# Patient Record
Sex: Female | Born: 1960 | Race: White | Hispanic: No | Marital: Single | Smoking: Never smoker
Health system: Southern US, Community
[De-identification: ages and names within clinical notes are randomized; demographics above are authoritative.]

## PROBLEM LIST (undated history)

## (undated) DIAGNOSIS — J45909 Unspecified asthma, uncomplicated: Secondary | ICD-10-CM

## (undated) DIAGNOSIS — F32A Depression, unspecified: Secondary | ICD-10-CM

## (undated) DIAGNOSIS — L723 Sebaceous cyst: Principal | ICD-10-CM

## (undated) DIAGNOSIS — E079 Disorder of thyroid, unspecified: Secondary | ICD-10-CM

## (undated) DIAGNOSIS — M1990 Unspecified osteoarthritis, unspecified site: Secondary | ICD-10-CM

## (undated) DIAGNOSIS — F329 Major depressive disorder, single episode, unspecified: Secondary | ICD-10-CM

## (undated) DIAGNOSIS — I10 Essential (primary) hypertension: Secondary | ICD-10-CM

## (undated) DIAGNOSIS — L089 Local infection of the skin and subcutaneous tissue, unspecified: Secondary | ICD-10-CM

## (undated) DIAGNOSIS — E785 Hyperlipidemia, unspecified: Secondary | ICD-10-CM

## (undated) HISTORY — DX: Hyperlipidemia, unspecified: E78.5

## (undated) HISTORY — DX: Unspecified asthma, uncomplicated: J45.909

## (undated) HISTORY — DX: Local infection of the skin and subcutaneous tissue, unspecified: L08.9

## (undated) HISTORY — DX: Sebaceous cyst: L72.3

## (undated) HISTORY — PX: NASAL SINUS SURGERY: SHX719

## (undated) HISTORY — DX: Major depressive disorder, single episode, unspecified: F32.9

## (undated) HISTORY — PX: PENILE ADHESIONS LYSIS: SHX2198

## (undated) HISTORY — DX: Depression, unspecified: F32.A

## (undated) HISTORY — DX: Unspecified osteoarthritis, unspecified site: M19.90

## (undated) HISTORY — DX: Disorder of thyroid, unspecified: E07.9

---

## 1998-12-20 ENCOUNTER — Other Ambulatory Visit: Admission: RE | Admit: 1998-12-20 | Discharge: 1998-12-20 | Payer: Self-pay | Admitting: Obstetrics and Gynecology

## 1999-01-09 ENCOUNTER — Encounter: Admission: RE | Admit: 1999-01-09 | Discharge: 1999-04-09 | Payer: Self-pay | Admitting: Obstetrics and Gynecology

## 2000-05-16 ENCOUNTER — Other Ambulatory Visit: Admission: RE | Admit: 2000-05-16 | Discharge: 2000-05-16 | Payer: Self-pay | Admitting: Obstetrics and Gynecology

## 2001-04-13 ENCOUNTER — Other Ambulatory Visit: Admission: RE | Admit: 2001-04-13 | Discharge: 2001-04-13 | Payer: Self-pay | Admitting: Obstetrics and Gynecology

## 2002-05-10 ENCOUNTER — Other Ambulatory Visit: Admission: RE | Admit: 2002-05-10 | Discharge: 2002-05-10 | Payer: Self-pay | Admitting: Obstetrics and Gynecology

## 2003-06-08 ENCOUNTER — Other Ambulatory Visit: Admission: RE | Admit: 2003-06-08 | Discharge: 2003-06-08 | Payer: Self-pay | Admitting: Obstetrics and Gynecology

## 2004-06-18 ENCOUNTER — Emergency Department (HOSPITAL_COMMUNITY): Admission: EM | Admit: 2004-06-18 | Discharge: 2004-06-18 | Payer: Self-pay | Admitting: Emergency Medicine

## 2007-07-22 ENCOUNTER — Encounter: Admission: RE | Admit: 2007-07-22 | Discharge: 2007-07-22 | Payer: Self-pay | Admitting: Obstetrics and Gynecology

## 2008-01-04 ENCOUNTER — Encounter: Admission: RE | Admit: 2008-01-04 | Discharge: 2008-01-04 | Payer: Self-pay | Admitting: Obstetrics and Gynecology

## 2010-10-09 ENCOUNTER — Emergency Department (HOSPITAL_COMMUNITY)
Admission: EM | Admit: 2010-10-09 | Discharge: 2010-10-09 | Disposition: A | Discharge status: Home / Self Care | Payer: PRIVATE HEALTH INSURANCE | Attending: Emergency Medicine | Admitting: Emergency Medicine

## 2010-10-09 ENCOUNTER — Emergency Department (HOSPITAL_COMMUNITY): Payer: PRIVATE HEALTH INSURANCE

## 2010-10-09 DIAGNOSIS — W208XXA Other cause of strike by thrown, projected or falling object, initial encounter: Secondary | ICD-10-CM | POA: Insufficient documentation

## 2010-10-09 DIAGNOSIS — S9030XA Contusion of unspecified foot, initial encounter: Secondary | ICD-10-CM | POA: Insufficient documentation

## 2010-10-09 DIAGNOSIS — M79609 Pain in unspecified limb: Secondary | ICD-10-CM | POA: Insufficient documentation

## 2010-10-09 DIAGNOSIS — I10 Essential (primary) hypertension: Secondary | ICD-10-CM | POA: Insufficient documentation

## 2012-01-10 ENCOUNTER — Encounter (HOSPITAL_COMMUNITY): Payer: Self-pay | Admitting: Emergency Medicine

## 2012-01-10 ENCOUNTER — Emergency Department (HOSPITAL_COMMUNITY)
Admission: EM | Admit: 2012-01-10 | Discharge: 2012-01-10 | Disposition: A | Discharge status: Home / Self Care | Payer: Self-pay | Attending: Emergency Medicine | Admitting: Emergency Medicine

## 2012-01-10 DIAGNOSIS — K0889 Other specified disorders of teeth and supporting structures: Secondary | ICD-10-CM

## 2012-01-10 DIAGNOSIS — K089 Disorder of teeth and supporting structures, unspecified: Secondary | ICD-10-CM | POA: Insufficient documentation

## 2012-01-10 DIAGNOSIS — I10 Essential (primary) hypertension: Secondary | ICD-10-CM | POA: Insufficient documentation

## 2012-01-10 HISTORY — DX: Essential (primary) hypertension: I10

## 2012-01-10 MED ORDER — CLINDAMYCIN HCL 300 MG PO CAPS
300.0000 mg | ORAL_CAPSULE | Freq: Once | ORAL | Status: AC
  Administered 2012-01-10: 300 mg via ORAL
  Filled 2012-01-10: qty 1

## 2012-01-10 MED ORDER — IBUPROFEN 200 MG PO TABS
600.0000 mg | ORAL_TABLET | Freq: Once | ORAL | Status: AC
  Administered 2012-01-10: 600 mg via ORAL
  Filled 2012-01-10: qty 1

## 2012-01-10 MED ORDER — CLINDAMYCIN HCL 300 MG PO CAPS: 300.0000 mg | ORAL_CAPSULE | Freq: Four times a day (QID) | ORAL | Status: AC

## 2012-01-10 MED ORDER — IBUPROFEN 600 MG PO TABS: 600.0000 mg | ORAL_TABLET | Freq: Three times a day (TID) | ORAL | Status: AC | PRN

## 2012-01-10 MED ORDER — OXYCODONE-ACETAMINOPHEN 5-325 MG PO TABS: 1.0000 | ORAL_TABLET | ORAL | Status: AC | PRN

## 2012-01-10 NOTE — ED Provider Notes (Signed)
History     CSN: [id number]  Arrival date & time 01/10/12  0903   First MD Initiated Contact with Patient 01/10/12 [phone]      Chief Complaint  Patient presents with  . Facial Swelling  . Dental Pain    (Consider location/radiation/quality/duration/timing/severity/associated sxs/prior treatment) The history is provided by the patient.   the patient reports several days of worsening pain in tooth #5.  She reports she woke this morning with right-sided facial swelling.  She's had dental pain for approximately 6 months but has never seen a dentist regarding this.  Her symptoms worsened a couple days ago.  She denies difficulty breathing or swallowing.  She has no other complaints.  Her pain is moderate in severity at this time.  Her pain is worsened by palpation of her tooth.  She has tried [place] powders at home for discomfort without relief in her symptoms  Past Medical History  Diagnosis Date  . Hypertension     History reviewed. No pertinent past surgical history.  No family history on file.  History  Substance Use Topics  . Smoking status: Never Smoker   . Smokeless tobacco: Not on file  . Alcohol Use: No    OB History    Grav Para Term Preterm Abortions TAB SAB Ect Mult Living                  Review of Systems  All other systems reviewed and are negative.    Allergies  Keflex; Penicillins; and Sulfa antibiotics  Home Medications   Current Outpatient Rx  Name Route Sig Dispense Refill  . ACETAMINOPHEN 500 MG PO TABS Oral Take 1,000 mg by mouth every 6 (six) hours as needed. For pain.    . BC HEADACHE POWDER PO Oral Take 1-2 packets by mouth 2 (two) times daily as needed. For pain.    . IBUPROFEN 200 MG PO TABS Oral Take 600 mg by mouth every 8 (eight) hours as needed. For pain.    Marland Kitchen CLINDAMYCIN HCL 300 MG PO CAPS Oral Take 1 capsule (300 mg total) by mouth 4 (four) times daily. 28 capsule 0  . IBUPROFEN 600 MG PO TABS Oral Take 1 tablet (600 mg total) by mouth  every 8 (eight) hours as needed for pain. 15 tablet 0  . OXYCODONE-ACETAMINOPHEN 5-325 MG PO TABS Oral Take 1 tablet by mouth every 4 (four) hours as needed for pain. 20 tablet 0    BP 158/93  Pulse 77  Temp 98.5 F (36.9 C) (Oral)  Resp 20  SpO2 100%  Physical Exam  Constitutional: She is oriented to person, place, and time. She appears well-developed and well-nourished.  HENT:  Head: Normocephalic.       Tenderness of tooth #5 without any gingival swelling or fluctuance.  Mild right-sided facial swelling.  Bilateral TMs are normal.  No mastoid tenderness.  No trismus or malocclusion.  Eyes: EOM are normal.  Neck: Normal range of motion.  Pulmonary/Chest: Effort normal.  Abdominal: She exhibits no distension.  Musculoskeletal: Normal range of motion.  Neurological: She is alert and oriented to person, place, and time.  Psychiatric: She has a normal mood and affect.    ED Course  Procedures (including critical care time)  Labs Reviewed - No data to display No results found.   1. Pain, dental       MDM  Dental Pain. Home with antibiotics and pain medicine. Recommend dental follow up. No signs of gingival abscess.  Tolerating secretions. Airway patent. No sub lingular swelling  Discharge home with that she followup recommended        Lyanne Co, MD 01/10/12 1023

## 2012-01-10 NOTE — ED Notes (Signed)
Pt presenting to ed with c/o right side facial swelling onset this am. Pt states positive toothache pain on her upper right and upper left side. Pt states her teeth have been bothering her x 6 months. Pt states positive right side earache. Pt states intermittent nausea.

## 2012-11-02 ENCOUNTER — Ambulatory Visit: Payer: BC Managed Care – PPO

## 2012-11-02 ENCOUNTER — Ambulatory Visit (INDEPENDENT_AMBULATORY_CARE_PROVIDER_SITE_OTHER): Payer: BC Managed Care – PPO | Admitting: Family Medicine

## 2012-11-02 VITALS — BP 142/80 | HR 73 | Temp 98.8°F | Resp 16 | Ht 64.0 in | Wt 203.0 lb

## 2012-11-02 DIAGNOSIS — M25561 Pain in right knee: Secondary | ICD-10-CM

## 2012-11-02 DIAGNOSIS — J029 Acute pharyngitis, unspecified: Secondary | ICD-10-CM

## 2012-11-02 DIAGNOSIS — R05 Cough: Secondary | ICD-10-CM

## 2012-11-02 DIAGNOSIS — S8391XA Sprain of unspecified site of right knee, initial encounter: Secondary | ICD-10-CM

## 2012-11-02 DIAGNOSIS — R059 Cough, unspecified: Secondary | ICD-10-CM

## 2012-11-02 DIAGNOSIS — Z131 Encounter for screening for diabetes mellitus: Secondary | ICD-10-CM

## 2012-11-02 DIAGNOSIS — M25569 Pain in unspecified knee: Secondary | ICD-10-CM

## 2012-11-02 LAB — POCT CBC
Hemoglobin: 12.4 g/dL (ref 12.2–16.2)
Lymph, poc: 2.5 (ref 0.6–3.4)
MCH, POC: 30.4 pg (ref 27–31.2)
MCHC: 30.5 g/dL — AB (ref 31.8–35.4)
MCV: 99.4 fL — AB (ref 80–97)
MPV: 10.6 fL (ref 0–99.8)
POC MID %: 5 %M (ref 0–12)
RBC: 4.08 M/uL (ref 4.04–5.48)
WBC: 14.5 10*3/uL — AB (ref 4.6–10.2)

## 2012-11-02 MED ORDER — BENZONATATE 100 MG PO CAPS: 100.0000 mg | ORAL_CAPSULE | Freq: Three times a day (TID) | ORAL | Status: DC | PRN

## 2012-11-02 MED ORDER — NAPROXEN 500 MG PO TABS: 500.0000 mg | ORAL_TABLET | Freq: Two times a day (BID) | ORAL | Status: DC

## 2012-11-02 MED ORDER — AZITHROMYCIN 250 MG PO TABS: ORAL_TABLET | ORAL | Status: DC

## 2012-11-02 NOTE — Patient Instructions (Addendum)
Medial Collateral Knee Ligament Sprain  with Phase I Rehab The medial collateral ligament (MCL) of the knee helps hold the knee joint in proper alignment and prevents the bones from shifting out of alignment (displacing) to the inside (medially). Injury to the knee may cause a tear in the MCL ligament (sprain). Sprains may heal without treatment, but this often results in a loose joint. Sprains are classified into three categories. Grade 1 sprains cause pain, but the tendon is not lengthened. Grade 2 sprains include a lengthened ligament, due to the ligament being stretched or partially ruptured. With grade 2 sprains, there is still function, although possibly decreased. Grade 3 sprains involve a complete tear of the tendon or muscle, and function is usually impaired. SYMPTOMS   Pain and tenderness on the inner side of the knee.  A "pop," tearing or pulling sensation at the time of injury.  Bruising (contusion) at the site of injury, within 48 hours of injury.  Knee stiffness.  Limping, often walking with the knee bent. CAUSES  An MCL sprain occurs when a force is placed on the ligament that is greater than it can handle. Common mechanisms of injury include:  Direct hit (trauma) to the outer side of the knee, especially if the foot is planted on the ground.  Forceful pivoting of the body and leg, while the foot is planted on the ground. RISK INCREASES WITH:  Contact sports (football, rugby).  Sports that require pivoting or cutting (soccer).  Poor knee strength and flexibility.  Improper equipment use. PREVENTION  Warm up and stretch properly before activity.  Maintain physical fitness:  Strength, flexibility and endurance.  Cardiovascular fitness.  Wear properly fitted protective equipment (correct length of cleats for surface).  Functional braces may be effective in preventing injury. PROGNOSIS  MCL tears usually heal without the need for surgery. Sometimes however,  surgery is required. RELATED COMPLICATIONS  Frequently recurring symptoms, such as the knee giving way, knee instability or knee swelling.  Injury to other structures in the knee joint:  Meniscal cartilage, resulting in locking and swelling of the knee.  Articular cartilage, resulting in knee arthritis.  Other ligaments of the knee.  Injury to nerves, resulting in numbness of the outer leg, foot or ankle and weakness or paralysis, with inability to raise the ankle or toes.  Knee stiffness. TREATMENT Treatment first involves the use of ice and medicine, to reduce pain and inflammation. The use of strengthening and stretching exercises may help reduce pain with activity. These exercises may be performed at home, but referral to a therapist is often advised. You may be advised to walk with crutches until you are able to walk without a limp. Your caregiver may provide you with a hinged knee brace to help regain a full range of motion, while also protecting the injured knee. For severe MCL injuries or injuries that involve other ligaments of the knee, surgery is often advised. MEDICATION  Do not take pain medicine for 7 days before surgery.  Only use over-the-counter pain medicine as directed by your caregiver.  Only use prescription pain relievers as directed and only in needed amounts. HEAT AND COLD  Cold treatment (icing) should be applied for 10 to 15 minutes every 2 to 3 hours for inflammation and pain, and immediately after any activity, that aggravates the symptoms. Use ice packs or an ice massage.  Heat treatment may be used before performing stretching and strengthening activities prescribed by your caregiver, physical therapist or athletic trainer.   Use a heat pack or warm water soak. SEEK MEDICAL CARE IF:   Symptoms get worse or do not improve in 4 to 6 weeks, despite treatment.  New, unexplained symptoms develop. EXERCISES  PHASE I EXERCISES  RANGE OF MOTION (ROM) AND  STRETCHING EXERCISES Medial Collateral Knee Ligament Sprain Phase I These are some of the initial exercises that your physician, physical therapist or athletic trainer may have you perform to begin your rehabilitation. When you demonstrate gains in your flexibility and strength, your caregiver may progress you to Phase II exercises. As you perform these exercises, remember:  These initial exercises are intended to be gentle. They will help you restore motion without increasing any swelling.  Completing these exercises allows less painful movement and prepares you for the more aggressive strengthening exercises in Phase II.  An effective stretch should be held for at least 30 seconds.  A stretch should never be painful. You should only feel a gentle lengthening or release in the stretched tissue. RANGE OF MOTION Knee Flexion, Active  Lie on your back with both knees straight. (If this causes back discomfort, bend your healthy knee, placing your foot flat on the floor.)  Slowly slide your heel back toward your buttocks until you feel a gentle stretch in the front of your knee or thigh.  Hold for __________ seconds. Slowly slide your heel back to the starting position. Repeat __________ times. Complete this exercise __________ times per day. STRETCH Knee Flexion, Supine  Lie on the floor with your right / left heel and foot lightly touching the wall. (Place both feet on the wall if you do not use a door frame.)  Without using any effort, allow gravity to slide your foot down the wall slowly until you feel a gentle stretch in the front of your right / left knee.  Hold this stretch for __________ seconds. Then return the leg to the starting position, using your health leg for help, if needed. Repeat __________ times. Complete this stretch __________ times per day. RANGE OF MOTION Knee Flexion and Extension, Active-Assisted  Sit on the edge of a table or chair with your thighs firmly supported.  It may be helpful to place a folded towel under the end of your right / left thigh.  Flexion (bending): Place the ankle of your healthy leg on top of the other ankle. Use your healthy leg to gently bend your right / left knee until you feel a mild tension across the top of your knee.  Hold for __________ seconds.  Extension (straightening): Switch your ankles so your right / left leg is on top. Use your healthy leg to straighten your right / left knee until you feel a mild tension on the backside of your knee.  Hold for __________ seconds. Repeat __________ times. Complete this exercise __________ times per day. STRETCH Knee Extension Sitting  Sit with your right / left leg/heel propped on another chair, coffee table, or foot stool.  Allow your leg muscles to relax, letting gravity straighten out your knee.*  You should feel a stretch behind your right / left knee. Hold this position for __________ seconds. Repeat __________ times. Complete this stretch __________ times per day. *Your physician, physical therapist or athletic trainer may instruct you to place a __________ weight on your thigh, just above your kneecap, to deepen the stretch. STRENGTHENING EXERCISES Medial Collateral Knee Ligament Sprain Phase I These exercises may help you when beginning to rehabilitate your injury. They may resolve your   symptoms with or without further involvement from your physician, physical therapist or athletic trainer. While completing these exercises, remember:   In order to return to more demanding activities, you will likely need to progress to more challenging exercises. Your physician, physical therapist or athletic trainer will advance your exercises when your tissues show adequate healing and your muscles demonstrate increased strength.  Muscles can gain both the endurance and the strength needed for everyday activities through controlled exercises.  Complete these exercises as instructed by  your physician, physical therapist or athletic trainer. Increase the resistance and repetitions only as guided by your caregiver. STRENGTH Quadriceps, Isometrics  Lie on your back with your right / left leg extended and your opposite knee bent.  Gradually tense the muscles in the front of your right / left thigh. You should see either your kneecap slide up toward your hip or an increased dimpling just above the knee. This motion will push the back of the knee down toward the floor, mat or bed on which you are lying.  Hold the muscle as tight as you can without increasing your pain for __________ seconds.  Relax the muscles slowly and completely in between each repetition. Repeat __________ times. Complete this exercise __________ times per day. STRENGTH Quadriceps, Short Arcs  Lie on your back. Place a __________ inch towel roll under your knee so that the knee slightly bends.  Raise only your lower leg by tightening the muscles in the front of your thigh. Do not allow your thigh to rise.  Hold this position for __________ seconds. Repeat __________ times. Complete this exercise __________ times per day. OPTIONAL ANKLE WEIGHTS: Begin with ____________________, but DO NOT exceed ____________________. Increase in 1 pound/0.5 kilogram increments.  STRENGTH- Quadriceps, Straight Leg Raises Quality counts! Watch for signs that the quadriceps muscle is working, to be sure you are strengthening the correct muscles and not "cheating" by substituting with healthier muscles.  Lay on your back with your right / left leg extended and your opposite knee bent.  Tense the muscles in the front of your right / left thigh. You should see either your knee cap slide up or increased dimpling just above the knee. Your thigh may even shake a bit.  Tighten these muscles even more and raise your leg 4 to 6 inches off the floor. Hold for __________ seconds.  Keeping these muscles tense, lower your leg.  Relax  the muscles slowly and completely in between each repetition. Repeat __________ times. Complete this exercise __________ times per day. STRENGTH Hamstring, Isometrics  Lie on your back on a firm surface.  Bend your right / left knee approximately __________ degrees.  Dig your heel into the surface as if you are trying to pull it toward your buttocks. Tighten the muscles in the back of your thighs to "dig" as hard as you can, without increasing any pain.  Hold this position for __________ seconds.  Release the tension gradually and allow your muscle to completely relax for __________ seconds in between each exercise. Repeat __________ times. Complete this exercise __________ times per day. STRENGTH Hamstring, Curls  Lay on your stomach with your legs extended. (If you lay on a bed, your feet may hang over the edge.)  Tighten the muscles in the back of your thigh to bend your right / left knee up to 90 degrees. Keep your hips flat on the bed.  Hold this position for __________ seconds.  Slowly lower your leg back to the starting   position. Repeat __________ times. Complete this exercise __________ times per day. OPTIONAL ANKLE WEIGHTS: Begin with ____________________, but DO NOT exceed ____________________. Increase in 1 pound/0.5 kilogram increments.  Document Released: 05/20/2005 Document Revised: 08/12/2011 Document Reviewed: 09/01/2008 [employer] Patient Information 2014 [employer].  

## 2012-11-02 NOTE — Progress Notes (Signed)
[address]   [place], [place]  [address]   [phone]  Subjective:    Patient ID: Deanna Mcguire, female    DOB: 1960/06/11, 52 y.o.   MRN: [id number]  HPI This 52 y.o. female presents for evaluation of the following:  1.  Sore throat:  Onset 1-2 weeks; +myalgias like the flu.  No fever but +chills/sweats.  No headache; mild ear pain B around glands; +ear congestion; +ST diffuse; +pain with swallowing.  No rhinorrhea; no nasal congestion; +coughing with yellow sputum; no SOB; +diarrhea this week; Goody Powders; +Claritin and Alkeseltzer Plus.    2.  R knee pain: onset of knee pain five days ago; helped friend move every day last week; might have twisted knee but climbed steps repetitively.  No knee swelling; painful with weight bearing medially.  No popping; no giving out.  No previous injury to knee.  No medications; no icing.  PCP:  None Gyn: Grewal   Review of Systems  Constitutional: Positive for chills, diaphoresis and fatigue. Negative for fever.  HENT: Positive for ear pain, sore throat, trouble swallowing and voice change. Negative for congestion, rhinorrhea, sneezing, drooling, mouth sores, dental problem, postnasal drip and sinus pressure.   Respiratory: Positive for cough. Negative for shortness of breath, wheezing and stridor.   Gastrointestinal: Positive for diarrhea. Negative for nausea and vomiting.  Musculoskeletal: Positive for arthralgias. Negative for myalgias and joint swelling.  Skin: Negative for rash.  Neurological: Negative for headaches.    Past Medical History  Diagnosis Date  . Hypertension   . Depression     History reviewed. No pertinent past surgical history.  Prior to Admission medications   Medication Sig Start Date End Date Taking? Authorizing Provider  ALPRAZolam Prudy Feeler) 0.5 MG tablet Take 0.5 mg by mouth 2 (two) times daily as needed for sleep.   Yes Historical Provider, MD  escitalopram (LEXAPRO) 10 MG tablet Take 10 mg by mouth daily.    Yes Historical Provider, MD    Allergies  Allergen Reactions  . Keflex (Cephalexin) Anaphylaxis  . Penicillins Anaphylaxis  . Sulfa Antibiotics     Childhood allergy    History   Social History  . Marital Status: Single    Spouse Name: N/A    Number of Children: N/A  . Years of Education: N/A   Occupational History  . Not on file.   Social History Main Topics  . Smoking status: Never Smoker   . Smokeless tobacco: Not on file  . Alcohol Use: No  . Drug Use: No  . Sexually Active:    Other Topics Concern  . Not on file   Social History Narrative  . No narrative on file    No family history on file.     Objective:   Physical Exam  Nursing note and vitals reviewed. Constitutional: She is oriented to person, place, and time. She appears well-developed and well-nourished. No distress.  HENT:  Head: Normocephalic and atraumatic.  Right Ear: External ear normal.  Left Ear: External ear normal.  Nose: Nose normal.  Mouth/Throat: Mucous membranes are normal. Posterior oropharyngeal edema and posterior oropharyngeal erythema present. No oropharyngeal exudate or tonsillar abscesses.  Eyes: Conjunctivae are normal. Pupils are equal, round, and reactive to light.  Neck: Normal range of motion. Neck supple.  Cardiovascular: Normal rate, regular rhythm and normal heart sounds.   Pulmonary/Chest: Effort normal and breath sounds normal. She has no wheezes. She has no rales.  Musculoskeletal:  Right knee: She exhibits normal range of motion, no swelling, no effusion, no ecchymosis, no deformity, no laceration, no erythema, no bony tenderness, normal meniscus and no MCL laxity. Tenderness found. Medial joint line tenderness noted. No lateral joint line, no MCL, no LCL and no patellar tendon tenderness noted.  Lymphadenopathy:    She has cervical adenopathy.  Neurological: She is alert and oriented to person, place, and time.  Skin: Skin is warm and dry. No rash noted. She  is not diaphoretic.  Psychiatric: She has a normal mood and affect. Her behavior is normal.    Results for orders placed in visit on 11/02/12  GLUCOSE, POCT (MANUAL RESULT ENTRY)      Result Value Range   POC Glucose 95  70 - 99 mg/dl  POCT RAPID STREP A (OFFICE)      Result Value Range   Rapid Strep A Screen Negative  Negative  POCT CBC      Result Value Range   WBC 14.5 (*) 4.6 - 10.2 K/uL   Lymph, poc 2.5  0.6 - 3.4   POC LYMPH PERCENT 16.9  10 - 50 %L   MID (cbc) 0.7  0 - 0.9   POC MID % 5.0  0 - 12 %M   POC Granulocyte 11.3 (*) 2 - 6.9   Granulocyte percent 78.1  37 - 80 %G   RBC 4.08  4.04 - 5.48 M/uL   Hemoglobin 12.4  12.2 - 16.2 g/dL   HCT, POC 62.9  52.8 - 47.9 %   MCV 99.4 (*) 80 - 97 fL   MCH, POC 30.4  27 - 31.2 pg   MCHC 30.5 (*) 31.8 - 35.4 g/dL   RDW, POC 41.3     Platelet Count, POC 219  142 - 424 K/uL   MPV 10.6  0 - 99.8 fL   [facility] reading (PRIMARY) by  Dr. Katrinka Blazing.  R KNEE: spurring; NAD.      Assessment & Plan:  Acute pharyngitis - Plan: POCT rapid strep A, Epstein-Barr virus VCA antibody panel, Culture, Group A Strep, azithromycin (ZITHROMAX) 250 MG tablet, benzonatate (TESSALON) 100 MG capsule  Pain, knee, right - Plan: DG Knee Complete 4 Views Right, POCT CBC, naproxen (NAPROSYN) 500 MG tablet  Screening for diabetes mellitus - Plan: POCT glucose (manual entry)  Sprain, knee, right, initial encounter  Cough   1.  Pharyngitis:  New. Send throat culture.  Obtain EBV titers; treat empirically with Zithromax while awaiting culture results.  Supportive care with rest, fluids, Naprosyn.  RTC inability to swallow. 2.  R knee pain/sprain medial:  New.  Overuse injury; recommend rest, ice, elevation; rx for Naprosyn provided to use scheduled for next week; home exercise program provided.  Call in two weeks if no improvement for ortho referral. 3. Cough:  New. Associated with pharyngitis; rx for Tessalon Perles provided.  Meds ordered this encounter    Medications  . escitalopram (LEXAPRO) 10 MG tablet    Sig: Take 10 mg by mouth daily.  Marland Kitchen ALPRAZolam (XANAX) 0.5 MG tablet    Sig: Take 0.5 mg by mouth 2 (two) times daily as needed for sleep.  Marland Kitchen azithromycin (ZITHROMAX) 250 MG tablet    Sig: Two tablets daily x 5 days    Dispense:  10 tablet    Refill:  0  . naproxen (NAPROSYN) 500 MG tablet    Sig: Take 1 tablet (500 mg total) by mouth 2 (two) times daily with a meal.  Dispense:  30 tablet    Refill:  0  . benzonatate (TESSALON) 100 MG capsule    Sig: Take 1-2 capsules (100-200 mg total) by mouth 3 (three) times daily as needed for cough.    Dispense:  60 capsule    Refill:  0

## 2012-11-04 LAB — EPSTEIN-BARR VIRUS VCA ANTIBODY PANEL
EBV NA IgG: 366 U/mL — ABNORMAL HIGH (ref <18.0)
EBV VCA IgM: 17.2 U/mL (ref <36.0)

## 2012-11-05 ENCOUNTER — Telehealth: Payer: Self-pay

## 2012-11-05 NOTE — Telephone Encounter (Signed)
Patient would like to know the results of recent labs and if she could have refill for antibiotics before she goes out of town, she tends to have a high tolerance to antibiotics.  Best [phone]

## 2012-11-05 NOTE — Telephone Encounter (Signed)
The labs are negative, she has had mono in the past but does not have current infection. Advised her when she finishes the ABX it will continue to work for another week.

## 2012-11-18 ENCOUNTER — Telehealth: Payer: Self-pay

## 2012-11-18 NOTE — Telephone Encounter (Signed)
I am sorry but I cannot refill her Zpack.  She should take motrin or tylenol for her pain and she should take mucinex for probable congestion causing both.

## 2012-11-18 NOTE — Telephone Encounter (Signed)
Called to advise left message, advised her to call me back.

## 2012-11-18 NOTE — Telephone Encounter (Signed)
Pt states that she is still having issues with her throat being sore and she is also experiencing pain in her left ear, pt would like to know if she could have a refill on the azithromicin. Pt does not have the money to come in and is currently out of town. Best# [phone] Pharmacy:[facility] [place] [phone]

## 2013-04-22 ENCOUNTER — Encounter (INDEPENDENT_AMBULATORY_CARE_PROVIDER_SITE_OTHER): Payer: Self-pay

## 2013-04-22 ENCOUNTER — Ambulatory Visit (INDEPENDENT_AMBULATORY_CARE_PROVIDER_SITE_OTHER): Payer: BC Managed Care – PPO | Admitting: Surgery

## 2013-04-22 ENCOUNTER — Encounter (INDEPENDENT_AMBULATORY_CARE_PROVIDER_SITE_OTHER): Payer: Self-pay | Admitting: Surgery

## 2013-04-22 VITALS — BP 128/76 | HR 72 | Temp 97.8°F | Resp 15 | Ht 64.0 in | Wt 214.0 lb

## 2013-04-22 DIAGNOSIS — L723 Sebaceous cyst: Secondary | ICD-10-CM

## 2013-04-22 DIAGNOSIS — L089 Local infection of the skin and subcutaneous tissue, unspecified: Secondary | ICD-10-CM | POA: Insufficient documentation

## 2013-04-22 HISTORY — DX: Local infection of the skin and subcutaneous tissue, unspecified: L08.9
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
## 2013-06-17 ENCOUNTER — Encounter (INDEPENDENT_AMBULATORY_CARE_PROVIDER_SITE_OTHER): Payer: BC Managed Care – PPO | Admitting: Surgery

## 2014-11-22 ENCOUNTER — Encounter: Payer: Self-pay | Admitting: Internal Medicine

## 2014-11-22 ENCOUNTER — Encounter (INDEPENDENT_AMBULATORY_CARE_PROVIDER_SITE_OTHER): Payer: Self-pay

## 2014-11-22 ENCOUNTER — Ambulatory Visit (INDEPENDENT_AMBULATORY_CARE_PROVIDER_SITE_OTHER)
Admission: RE | Admit: 2014-11-22 | Discharge: 2014-11-22 | Disposition: A | Discharge status: Home / Self Care | Payer: BLUE CROSS/BLUE SHIELD | Source: Ambulatory Visit | Attending: Internal Medicine | Admitting: Internal Medicine

## 2014-11-22 ENCOUNTER — Ambulatory Visit (INDEPENDENT_AMBULATORY_CARE_PROVIDER_SITE_OTHER): Payer: BLUE CROSS/BLUE SHIELD | Admitting: Internal Medicine

## 2014-11-22 ENCOUNTER — Other Ambulatory Visit: Payer: BLUE CROSS/BLUE SHIELD

## 2014-11-22 VITALS — BP 134/82 | HR 73 | Ht 64.0 in | Wt 241.2 lb

## 2014-11-22 DIAGNOSIS — R062 Wheezing: Secondary | ICD-10-CM

## 2014-11-22 DIAGNOSIS — Z9109 Other allergy status, other than to drugs and biological substances: Secondary | ICD-10-CM

## 2014-11-22 DIAGNOSIS — Z91048 Other nonmedicinal substance allergy status: Secondary | ICD-10-CM | POA: Diagnosis not present

## 2014-11-22 LAB — NITRIC OXIDE: NITRIC OXIDE: 5

## 2014-11-22 MED ORDER — LEVALBUTEROL HCL 0.63 MG/3ML IN NEBU
0.6300 mg | INHALATION_SOLUTION | Freq: Once | RESPIRATORY_TRACT | Status: AC
  Administered 2014-11-22: 0.63 mg via RESPIRATORY_TRACT

## 2014-11-22 NOTE — Progress Notes (Signed)
Subjective:    Patient ID: Deanna Mcguire, female    DOB: 10/09/1960, 55 y.o.   MRN: [id number]  HPI  PCP Jeani Hawking, MD   IOV 11/22/2014  Chief Complaint  Patient presents with  . Pulmonary Consult    Pt referred by Dr. Vincente Poli for chronic wheezing 1 year. Pt c/o chest congestion, cough.     54 year old obese female referred for wheezing as above.  Lifelong nonsmoker. Family from [place]. Reports history of childhood asthma during which time when she would travel to [place] she would get really ill with asthma. In her youth/adolescent/young adult she was taken care of by an allergist in [facility] name unknown. She recollects 5 years of allergy shots which help. Subsequently she believes she outgrew asthma. Over the last 20-30 years she has lived in [place] in a country home and did not have any respiratory issues. Approximately a year ago she moved to [place], [place] to live in an apartment [age unknown but believed to be "cold"] near country part. Since moving to his apartment she noticed insidious onset of wheezing that is persistent and moderate-severe in category and associated chest tightness and shortness of breath. Made worse by being inside the apartment and also sometimes at work. No clear cut relieving factors other than feeling better when she is outside. Unclear if wheezing is related to weather issues or other trigger factors. Was prescribed nebulizer inhaler but she could not afford this but sporadic nebulizer treatments in the office have helped. There is no associated hemoptysis or edema.  Approximately a year ago she did have to urgent care visits to a physician for acute deterioration and wheezing and was treated with antibodies and prednisone and nebulizer which help. Does not recollect any hospitalizations or pneumonia diagnosis.  Above findings confirmed in referral note as well  Exposure history - Denies smoking, mold exposure Exposure -  Positive for pollen and tree  exposure which she believes triggers her  Symptoms - Denies heart failure diagnosis  Outside chart lab review - 11/17/2014: Absolute eosinophil count is 0.3 which is 3%. Renal function liver function and hemoglobin A1c are normal  Today office lab - Exhaled nitric oxide is 3 ppb and normal    has a past medical history of Hypertension; Depression; Arthritis; Asthma; Hyperlipidemia; Thyroid disease; and Infected sebaceous cyst, left buttocks. (04/22/2013).   reports that she has never smoked. She has never used smokeless tobacco.  Past Surgical History  Procedure Laterality Date  . Penile adhesions lysis      near liver and female organs  . Nasal sinus surgery      3    Allergies  Allergen Reactions  . Keflex [Cephalexin] Anaphylaxis  . Penicillins Anaphylaxis  . Sulfa Antibiotics     Childhood allergy     There is no immunization history on file for this patient.  Family History  Problem Relation Age of Onset  . Cancer Mother     kidney & lung  . Heart disease Father   . Stroke Father   . COPD Sister   . Hepatitis C Sister      Current outpatient prescriptions:  .  ALPRAZolam (XANAX) 0.5 MG tablet, Take 0.5 mg by mouth 2 (two) times daily as needed for sleep., Disp: , Rfl:  .  escitalopram (LEXAPRO) 10 MG tablet, Take 10 mg by mouth daily., Disp: , Rfl:  .  gabapentin (NEURONTIN) 800 MG tablet, Take 800 mg by mouth 2 (two) times  daily., Disp: , Rfl:     Review of Systems  Constitutional: Negative for fever and unexpected weight change.  HENT: Negative for congestion, dental problem, ear pain, nosebleeds, postnasal drip, rhinorrhea, sinus pressure, sneezing, sore throat and trouble swallowing.   Eyes: Negative for redness and itching.  Respiratory: Positive for cough, shortness of breath and wheezing. Negative for chest tightness.   Cardiovascular: Negative for palpitations and leg swelling.  Gastrointestinal: Negative for nausea  and vomiting.  Genitourinary: Negative for dysuria.  Musculoskeletal: Negative for joint swelling.  Skin: Negative for rash.  Neurological: Negative for headaches.  Hematological: Does not bruise/bleed easily.  Psychiatric/Behavioral: Negative for dysphoric mood. The patient is not nervous/anxious.        Objective:   Physical Exam  Constitutional: She is oriented to person, place, and time. She appears well-developed and well-nourished. No distress.  Body mass index is 41.38 kg/(m^2).   HENT:  Head: Normocephalic and atraumatic.  Right Ear: External ear normal.  Left Ear: External ear normal.  Mouth/Throat: Oropharynx is clear and moist. No oropharyngeal exudate.  Eyes: Conjunctivae and EOM are normal. Pupils are equal, round, and reactive to light. Right eye exhibits no discharge. Left eye exhibits no discharge. No scleral icterus.  Neck: Normal range of motion. Neck supple. No JVD present. No tracheal deviation present. No thyromegaly present.  Cardiovascular: Normal rate, regular rhythm, normal heart sounds and intact distal pulses.  Exam reveals no gallop and no friction rub.   No murmur heard. Pulmonary/Chest: Effort normal. No respiratory distress. She has wheezes. She has no rales. She exhibits no tenderness.  Scattered end expiratory distal wheeze +  Abdominal: Soft. Bowel sounds are normal. She exhibits no distension and no mass. There is no tenderness. There is no rebound and no guarding.  Musculoskeletal: Normal range of motion. She exhibits no edema or tenderness.  Lymphadenopathy:    She has no cervical adenopathy.  Neurological: She is alert and oriented to person, place, and time. She has normal reflexes. No cranial nerve deficit. She exhibits normal muscle tone. Coordination normal.  Skin: Skin is warm and dry. No rash noted. She is not diaphoretic. No erythema. No pallor.  Psychiatric: She has a normal mood and affect. Her behavior is normal. Judgment and thought  content normal.  Vitals reviewed.   Filed Vitals:   11/22/14 1332  BP: 134/82  Pulse: 73  Height:  (1.626 m)  Weight: 241 lb 3.2 oz (109.408 kg)  SpO2: 97%         Assessment & Plan:     ICD-9-CM ICD-10-CM   1. Wheezing 786.07 R06.2   2. History of environmental allergies V15.09 Z91.048    Unclear if you have asthma or not Suspicion fairly high for allergic asthma as first diagnosis despite normal Exhaled Nitric Oxide  PLAN Empiric nebulizer Treatment in office for wheeze Do IgE and full blood allergy panel DO methacholine challlenge test; cannot be pregnant or have heart diseaese for it Do CXR 2 view  Follolwuop - return anytime next 10 days to see me or my NP for followup and lab review - if these tests are negative for asthma, wil lhave to look for alternate diagnosis such as vocal cord or heart  Note: will refer to sleep doctor at some point in future for snoring    Dr. Kalman Shan, M.D., [facility].C.P Pulmonary and Critical Care Medicine Staff Physician [facility] Pager: [phone], If no answer or  between  15:00h - 7:00h: call [phone]  11/22/2014 2:04 PM

## 2014-11-22 NOTE — Addendum Note (Signed)
Addended by: Nicanor Alcon on: 11/22/2014 05:25 PM   Modules accepted: Orders

## 2014-11-22 NOTE — Patient Instructions (Addendum)
ICD-9-CM ICD-10-CM   1. Wheezing 786.07 R06.2   2. History of environmental allergies V15.09 Z91.048    Unclear if you have asthma or not Suspicion fairly high for allergic asthma as first diagnosis despite normal Exhaled Nitric Oxide  PLAN Empiric nebulizer Treatment in office for wheeze Do IgE and full blood allergy panel DO methacholine challlenge test; cannot be pregnant or have heart diseaese for it Do CXR 2 view  Follolwuop - return anytime next 10 days to see me or my NP for followup and lab review - if these tests are negative for asthma, wil lhave to look for alternate diagnosis such as vocal cord or heart  Note: will refer to sleep doctor at some point in future for snoring

## 2014-11-22 NOTE — Addendum Note (Signed)
Addended by: Nicanor Alcon on: 11/22/2014 05:32 PM   Modules accepted: Orders

## 2014-11-23 LAB — ALLERGY FULL PROFILE
Allergen, D pternoyssinus,d7: 9.19 kU/L — ABNORMAL HIGH
Allergen,Goose feathers, e70: <0.1 kU/L
Aspergillus fumigatus, m3: <0.1 kU/L
BERMUDA GRASS: 0.18 kU/L — AB
Bahia Grass: 0.56 kU/L — ABNORMAL HIGH
CAT DANDER: 0.35 kU/L — AB
Candida Albicans: <0.1 kU/L
Curvularia lunata: <0.1 kU/L
D. farinae: 8.17 kU/L — ABNORMAL HIGH
Dog Dander: <0.1 kU/L
Elm IgE: <0.1 kU/L
FESCUE: 1.11 kU/L — AB
G005 Rye, Perennial: 0.89 kU/L — ABNORMAL HIGH
G009 RED TOP: 0.99 kU/L — AB
Goldenrod: <0.1 kU/L
Helminthosporium halodes: <0.1 kU/L
House Dust Hollister: 0.58 kU/L — ABNORMAL HIGH
IgE (Immunoglobulin E), Serum: 237 kU/L — ABNORMAL HIGH (ref <115)
Lamb's Quarters: <0.1 kU/L
Stemphylium Botryosum: <0.1 kU/L
Timothy Grass: 0.76 kU/L — ABNORMAL HIGH

## 2014-11-24 ENCOUNTER — Telehealth: Payer: Self-pay | Admitting: Internal Medicine

## 2014-11-24 NOTE — Telephone Encounter (Signed)
Called and spoke to pt. Informed her of the results and recs per MR. Pt verbalized understanding. Appt made with TP on 7/12 (first available). Pt aware.

## 2014-11-24 NOTE — Telephone Encounter (Signed)
Deanna Mcguire  You are seeing her 12/13/14 after her [facility] challenge 11/30/14  She ha wheezinghx and on exam had wheezing. Oddly FeNO was low but her IgE was high and blood allergy panel positive. SO maybe MC will be positive.; if positive start asthma Rx (mdi +/- pred). I would also consider allergy referral to DR Maple Hudson

## 2014-11-24 NOTE — Telephone Encounter (Signed)
cxr is normal  Blood allergy panel - positive for variety of things  IgE - elevated  PLAN Finish methacholine challenge test 11/30/14 See me or TP or Clint Young soon after [facility] challenge - let me know who  Dr. Kalman Shan, M.D., [facility].C.P Pulmonary and Critical Care Medicine Staff Physician [facility] and Critical Care Pager: [phone], If no answer or between  15:00h - 7:00h: call [phone]  11/24/2014 2:20 AM

## 2014-11-24 NOTE — Telephone Encounter (Signed)
Will forward to TP

## 2014-11-30 ENCOUNTER — Encounter (HOSPITAL_COMMUNITY): Payer: BLUE CROSS/BLUE SHIELD

## 2014-12-13 ENCOUNTER — Ambulatory Visit: Payer: BLUE CROSS/BLUE SHIELD | Admitting: Adult Health

## 2015-05-31 ENCOUNTER — Emergency Department (HOSPITAL_COMMUNITY): Payer: BLUE CROSS/BLUE SHIELD

## 2015-05-31 ENCOUNTER — Emergency Department (HOSPITAL_COMMUNITY)
Admission: EM | Admit: 2015-05-31 | Discharge: 2015-05-31 | Disposition: A | Discharge status: Home / Self Care | Payer: BLUE CROSS/BLUE SHIELD | Attending: Emergency Medicine | Admitting: Emergency Medicine

## 2015-05-31 ENCOUNTER — Encounter (HOSPITAL_COMMUNITY): Payer: Self-pay

## 2015-05-31 DIAGNOSIS — Z872 Personal history of diseases of the skin and subcutaneous tissue: Secondary | ICD-10-CM | POA: Diagnosis not present

## 2015-05-31 DIAGNOSIS — I10 Essential (primary) hypertension: Secondary | ICD-10-CM | POA: Diagnosis not present

## 2015-05-31 DIAGNOSIS — J45909 Unspecified asthma, uncomplicated: Secondary | ICD-10-CM | POA: Insufficient documentation

## 2015-05-31 DIAGNOSIS — M79672 Pain in left foot: Secondary | ICD-10-CM | POA: Diagnosis present

## 2015-05-31 DIAGNOSIS — J019 Acute sinusitis, unspecified: Secondary | ICD-10-CM | POA: Diagnosis not present

## 2015-05-31 DIAGNOSIS — F329 Major depressive disorder, single episode, unspecified: Secondary | ICD-10-CM | POA: Diagnosis not present

## 2015-05-31 DIAGNOSIS — Z880 Allergy status to penicillin: Secondary | ICD-10-CM | POA: Diagnosis not present

## 2015-05-31 DIAGNOSIS — Z79899 Other long term (current) drug therapy: Secondary | ICD-10-CM | POA: Insufficient documentation

## 2015-05-31 DIAGNOSIS — M722 Plantar fascial fibromatosis: Secondary | ICD-10-CM | POA: Insufficient documentation

## 2015-05-31 DIAGNOSIS — E669 Obesity, unspecified: Secondary | ICD-10-CM | POA: Diagnosis not present

## 2015-05-31 DIAGNOSIS — M1990 Unspecified osteoarthritis, unspecified site: Secondary | ICD-10-CM | POA: Insufficient documentation

## 2015-05-31 LAB — CBC
HEMATOCRIT: 39.4 % (ref 36.0–46.0)
HEMOGLOBIN: 12.8 g/dL (ref 12.0–15.0)
MCH: 30.8 pg (ref 26.0–34.0)
MCHC: 32.5 g/dL (ref 30.0–36.0)
MCV: 94.7 fL (ref 78.0–100.0)
Platelets: 200 10*3/uL (ref 150–400)
RBC: 4.16 MIL/uL (ref 3.87–5.11)
RDW: 12.7 % (ref 11.5–15.5)
WBC: 6.3 10*3/uL (ref 4.0–10.5)

## 2015-05-31 LAB — BASIC METABOLIC PANEL
Anion gap: 10 (ref 5–15)
BUN: 12 mg/dL (ref 6–20)
CHLORIDE: 103 mmol/L (ref 101–111)
CO2: 24 mmol/L (ref 22–32)
CREATININE: 0.6 mg/dL (ref 0.44–1.00)
Calcium: 9.3 mg/dL (ref 8.9–10.3)
GFR calc non Af Amer: >60 mL/min (ref >60)
Glucose, Bld: 160 mg/dL — ABNORMAL HIGH (ref 65–99)
Potassium: 3.7 mmol/L (ref 3.5–5.1)
Sodium: 137 mmol/L (ref 135–145)

## 2015-05-31 LAB — I-STAT TROPONIN, ED: TROPONIN I, POC: 0 ng/mL (ref 0.00–0.08)

## 2015-05-31 MED ORDER — CYCLOBENZAPRINE HCL 10 MG PO TABS: 10.0000 mg | ORAL_TABLET | Freq: Two times a day (BID) | ORAL | Status: DC | PRN

## 2015-05-31 MED ORDER — PREDNISONE 50 MG PO TABS: ORAL_TABLET | ORAL | Status: DC

## 2015-05-31 MED ORDER — AZITHROMYCIN 250 MG PO TABS: ORAL_TABLET | ORAL | Status: DC

## 2015-05-31 NOTE — ED Notes (Signed)
Pt in bathroom, unable to get ECG.

## 2015-05-31 NOTE — Discharge Instructions (Signed)
Prescriptions for antibiotic, muscle relaxer, prednisone. Recommend follow-up with [facility] at [phone].  Also recommend a primary care physician. Ice to  feet.

## 2015-05-31 NOTE — ED Notes (Signed)
Bed: [id number] Expected date:  Expected time:  Means of arrival:  Comments: Pt changing

## 2015-05-31 NOTE — ED Notes (Addendum)
Went to let pt know the MD was working on her discharge papers. Pt not in room although I had just seen her a few minutes prior. Looked in the nearby restrooms and waited 5 minutes, but pt did not return to room. Assumed pt left the department. Tried to call home phone on file, but pt did not pick up and voicemail was not set up.

## 2015-05-31 NOTE — ED Notes (Signed)
She c/o chest "heaviness" plus uri sx since yesterday.  She also c/o gradually increasing pain with difficulty ambulating d/t pain left foot and ankle x 1 year.  She is in no distress.

## 2015-05-31 NOTE — ED Provider Notes (Signed)
CSN: [id number]     Arrival date & time 05/31/15  1102 History   First MD Initiated Contact with Patient 05/31/15 1333     Chief Complaint  Patient presents with  . Chest Pain  . URI  . Foot Pain     (Consider location/radiation/quality/duration/timing/severity/associated sxs/prior Treatment) HPI..... Patient presents with chronic cough, sinus congestion, pain in her left foot and ankle for several months. No substernal chest pain, dyspnea, diaphoresis, nausea, fever, sweats, chills.. She is overweight. Past medical history includes hypertension, hyperlipidemia, depression.  Zithromax has helped in the past for her chronic sinus infection. Severity symptoms is mild to moderate.  Past Medical History  Diagnosis Date  . Hypertension   . Depression   . Arthritis   . Asthma   . Hyperlipidemia   . Thyroid disease   . Infected sebaceous cyst, left buttocks. 04/22/2013   Past Surgical History  Procedure Laterality Date  . Penile adhesions lysis      near liver and female organs  . Nasal sinus surgery      3   Family History  Problem Relation Age of Onset  . Cancer Mother     kidney & lung  . Heart disease Father   . Stroke Father   . COPD Sister   . Hepatitis C Sister    Social History  Substance Use Topics  . Smoking status: Never Smoker   . Smokeless tobacco: Never Used  . Alcohol Use: No   OB History    No data available     Review of Systems  All other systems reviewed and are negative.     Allergies  Keflex; Penicillins; and Sulfa antibiotics  Home Medications   Prior to Admission medications   Medication Sig Start Date End Date Taking? Authorizing Provider  ALPRAZolam Prudy Feeler) 1 MG tablet Take 1 mg by mouth 2 (two) times daily as needed. anxiety 04/25/15  Yes Historical Provider, MD  escitalopram (LEXAPRO) 10 MG tablet Take 10 mg by mouth daily.   Yes Historical Provider, MD  escitalopram (LEXAPRO) 20 MG tablet Take 20 mg by mouth every morning.    Yes  Historical Provider, MD  gabapentin (NEURONTIN) 800 MG tablet Take 800 mg by mouth 2 (two) times daily.   Yes Historical Provider, MD  SYMBICORT 160-4.5 MCG/ACT inhaler Inhale 2 puffs into the lungs at bedtime. 05/21/15  Yes Historical Provider, MD  azithromycin (ZITHROMAX Z-PAK) 250 MG tablet 2 tablets day 1;     1 tablet day 2 through 5 05/31/15   Donnetta Hutching, MD  cyclobenzaprine (FLEXERIL) 10 MG tablet Take 1 tablet (10 mg total) by mouth 2 (two) times daily as needed for muscle spasms. 05/31/15   Donnetta Hutching, MD  predniSONE (DELTASONE) 50 MG tablet 1 tablet for 7 days; one half tablet for 7 days 05/31/15   Donnetta Hutching, MD   BP 174/97 mmHg  Pulse 70  Temp(Src) 98.2 F (36.8 C) (Oral)  Resp 16  SpO2 99% Physical Exam  Constitutional: She is oriented to person, place, and time.  Obese  HENT:  Head: Normocephalic and atraumatic.  Minimal bilateral frontal and maxillary sinus tenderness.  Eyes: Conjunctivae and EOM are normal. Pupils are equal, round, and reactive to light.  Neck: Normal range of motion. Neck supple.  Cardiovascular: Normal rate and regular rhythm.   Pulmonary/Chest: Effort normal and breath sounds normal.  Abdominal: Soft. Bowel sounds are normal.  Musculoskeletal: Normal range of motion.  Neurological: She is alert and oriented  to person, place, and time.  Skin:  Left lower extremity: Minimal ankle tenderness medially and laterally. Moderate tenderness on the heel to palpation.  Psychiatric: She has a normal mood and affect. Her behavior is normal.  Nursing note and vitals reviewed.   ED Course  Procedures (including critical care time) Labs Review Labs Reviewed  BASIC METABOLIC PANEL - Abnormal; Notable for the following:    Glucose, Bld 160 (*)    All other components within normal limits  CBC  I-STAT TROPOININ, ED    Imaging Review Dg Chest 2 View  05/31/2015  CLINICAL DATA:  Sob, c/p since 8:00 last pm, nonsmoker. EXAM: CHEST  2 VIEW COMPARISON:   11/22/2014. FINDINGS: The heart size and mediastinal contours are within normal limits. Both lungs are clear but slightly hyperinflated. The visualized skeletal structures are unremarkable. Old LEFT-sided rib fractures are stable. IMPRESSION: No active cardiopulmonary disease.  Stable appearance from priors. Electronically Signed   By: Elsie StainJohn T Curnes M.D.   On: 05/31/2015 11:57   Dg Ankle Complete Left  05/31/2015  CLINICAL DATA:  Pain.  No injury. EXAM: LEFT ANKLE COMPLETE - 3+ VIEW COMPARISON:  None. FINDINGS: There is no evidence of fracture, dislocation, or joint effusion. There is no evidence of arthropathy or other focal bone abnormality. Soft tissues are unremarkable. IMPRESSION: Negative. Electronically Signed   By: Elsie StainJohn T Curnes M.D.   On: 05/31/2015 11:59   Dg Foot Complete Left  05/31/2015  CLINICAL DATA:  Pain.  No history of trauma EXAM: LEFT FOOT - COMPLETE 3+ VIEW COMPARISON:  None. FINDINGS: Frontal, oblique, and lateral views were obtained. There is no acute fracture or dislocation. There are flexion deformities at the second, third, fourth, and fifth PIP joints. There is marked hallux valgus deformity at the first MTP joint. There is moderate narrowing of the first MTP joint. There is no erosive change. There is a small inferior calcaneal spur. IMPRESSION: Marked hallux valgus deformity at the first MTP joint. Flexion deformities at the second, third, fourth, and fifth PIP joints. 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 No fracture or dislocation. Small inferior calcaneal spur present. Electronically Signed   By: Bretta BangWilliam  Woodruff III M.D.   On: 05/31/2015 11:59   I have personally reviewed and evaluated these images and lab results as part of my medical decision-making.   EKG Interpretation None      MDM   Final diagnoses:  Left foot pain  Acute sinusitis, recurrence not specified, unspecified location  Plantar fasciitis    Patient is in no acute distress. I believe  she has sinusitis and plantar fasciitis. Will Rx prednisone, Zithromax, Flexeril 10 mg, Claritin. Referral to podiatry for her feet. She was also encouraged to get a primary care physician    Donnetta HutchingBrian Tramon Crescenzo, MD 05/31/15 1520

## 2015-09-01 ENCOUNTER — Encounter (HOSPITAL_COMMUNITY): Payer: Self-pay | Admitting: Emergency Medicine

## 2015-09-01 ENCOUNTER — Emergency Department (HOSPITAL_COMMUNITY)
Admission: EM | Admit: 2015-09-01 | Discharge: 2015-09-01 | Disposition: A | Discharge status: Home / Self Care | Payer: BLUE CROSS/BLUE SHIELD | Attending: Emergency Medicine | Admitting: Emergency Medicine

## 2015-09-01 DIAGNOSIS — R55 Syncope and collapse: Secondary | ICD-10-CM | POA: Diagnosis not present

## 2015-09-01 DIAGNOSIS — Z880 Allergy status to penicillin: Secondary | ICD-10-CM | POA: Diagnosis not present

## 2015-09-01 DIAGNOSIS — Z872 Personal history of diseases of the skin and subcutaneous tissue: Secondary | ICD-10-CM | POA: Insufficient documentation

## 2015-09-01 DIAGNOSIS — Z8639 Personal history of other endocrine, nutritional and metabolic disease: Secondary | ICD-10-CM | POA: Diagnosis not present

## 2015-09-01 DIAGNOSIS — F329 Major depressive disorder, single episode, unspecified: Secondary | ICD-10-CM | POA: Insufficient documentation

## 2015-09-01 DIAGNOSIS — R112 Nausea with vomiting, unspecified: Secondary | ICD-10-CM | POA: Diagnosis not present

## 2015-09-01 DIAGNOSIS — J45909 Unspecified asthma, uncomplicated: Secondary | ICD-10-CM | POA: Diagnosis not present

## 2015-09-01 DIAGNOSIS — Z79899 Other long term (current) drug therapy: Secondary | ICD-10-CM | POA: Insufficient documentation

## 2015-09-01 DIAGNOSIS — Z8739 Personal history of other diseases of the musculoskeletal system and connective tissue: Secondary | ICD-10-CM | POA: Diagnosis not present

## 2015-09-01 DIAGNOSIS — Z7951 Long term (current) use of inhaled steroids: Secondary | ICD-10-CM | POA: Diagnosis not present

## 2015-09-01 DIAGNOSIS — I10 Essential (primary) hypertension: Secondary | ICD-10-CM | POA: Diagnosis not present

## 2015-09-01 LAB — COMPREHENSIVE METABOLIC PANEL
ALBUMIN: 4.2 g/dL (ref 3.5–5.0)
ALK PHOS: 82 U/L (ref 38–126)
ALT: 16 U/L (ref 14–54)
AST: 18 U/L (ref 15–41)
Anion gap: 13 (ref 5–15)
BUN: 19 mg/dL (ref 6–20)
CHLORIDE: 100 mmol/L — AB (ref 101–111)
CO2: 23 mmol/L (ref 22–32)
CREATININE: 0.64 mg/dL (ref 0.44–1.00)
Calcium: 9 mg/dL (ref 8.9–10.3)
GFR calc non Af Amer: >60 mL/min (ref >60)
GLUCOSE: 157 mg/dL — AB (ref 65–99)
Potassium: 3.9 mmol/L (ref 3.5–5.1)
SODIUM: 136 mmol/L (ref 135–145)
Total Bilirubin: 0.5 mg/dL (ref 0.3–1.2)
Total Protein: 7.7 g/dL (ref 6.5–8.1)

## 2015-09-01 LAB — CBC WITH DIFFERENTIAL/PLATELET
BASOS ABS: 0 10*3/uL (ref 0.0–0.1)
BASOS PCT: 0 %
EOS ABS: 0 10*3/uL (ref 0.0–0.7)
EOS PCT: 0 %
HCT: 39 % (ref 36.0–46.0)
HEMOGLOBIN: 12.7 g/dL (ref 12.0–15.0)
LYMPHS ABS: 0.6 10*3/uL — AB (ref 0.7–4.0)
Lymphocytes Relative: 10 %
MCH: 30.4 pg (ref 26.0–34.0)
MCHC: 32.6 g/dL (ref 30.0–36.0)
MCV: 93.3 fL (ref 78.0–100.0)
Monocytes Absolute: 0.6 10*3/uL (ref 0.1–1.0)
Monocytes Relative: 9 %
NEUTROS PCT: 81 %
Neutro Abs: 4.9 10*3/uL (ref 1.7–7.7)
PLATELETS: 222 10*3/uL (ref 150–400)
RBC: 4.18 MIL/uL (ref 3.87–5.11)
RDW: 13.3 % (ref 11.5–15.5)
WBC: 6 10*3/uL (ref 4.0–10.5)

## 2015-09-01 LAB — URINALYSIS, ROUTINE W REFLEX MICROSCOPIC
BILIRUBIN URINE: NEGATIVE
Glucose, UA: NEGATIVE mg/dL
Hgb urine dipstick: NEGATIVE
KETONES UR: NEGATIVE mg/dL
LEUKOCYTES UA: NEGATIVE
NITRITE: NEGATIVE
PROTEIN: NEGATIVE mg/dL
Specific Gravity, Urine: 1.02 (ref 1.005–1.030)
pH: 7 (ref 5.0–8.0)

## 2015-09-01 LAB — TROPONIN I

## 2015-09-01 LAB — LIPASE, BLOOD: Lipase: 18 U/L (ref 11–51)

## 2015-09-01 MED ORDER — SODIUM CHLORIDE 0.9 % IV BOLUS (SEPSIS): 500.0000 mL | Freq: Once | INTRAVENOUS | Status: DC

## 2015-09-01 MED ORDER — SODIUM CHLORIDE 0.9 % IV BOLUS (SEPSIS): 1000.0000 mL | Freq: Once | INTRAVENOUS | Status: DC

## 2015-09-01 MED ORDER — ONDANSETRON 4 MG PO TBDP: 4.0000 mg | ORAL_TABLET | Freq: Three times a day (TID) | ORAL | Status: DC | PRN

## 2015-09-01 MED ORDER — ONDANSETRON HCL 4 MG/2ML IJ SOLN
4.0000 mg | Freq: Once | INTRAMUSCULAR | Status: AC
  Administered 2015-09-01: 4 mg via INTRAVENOUS
  Filled 2015-09-01: qty 2

## 2015-09-01 MED ORDER — SODIUM CHLORIDE 0.9 % IV BOLUS (SEPSIS)
1000.0000 mL | Freq: Once | INTRAVENOUS | Status: AC
  Administered 2015-09-01: 1000 mL via INTRAVENOUS

## 2015-09-01 MED ORDER — FAMOTIDINE IN NACL 20-0.9 MG/50ML-% IV SOLN
20.0000 mg | INTRAVENOUS | Status: AC
  Administered 2015-09-01: 20 mg via INTRAVENOUS
  Filled 2015-09-01: qty 50

## 2015-09-01 MED ORDER — GABAPENTIN 400 MG PO CAPS
400.0000 mg | ORAL_CAPSULE | Freq: Once | ORAL | Status: AC
  Administered 2015-09-01: 400 mg via ORAL
  Filled 2015-09-01: qty 1

## 2015-09-01 MED ORDER — ONDANSETRON HCL 4 MG/2ML IJ SOLN
4.0000 mg | Freq: Once | INTRAMUSCULAR | Status: DC
  Filled 2015-09-01: qty 2

## 2015-09-01 MED ORDER — DIPHENHYDRAMINE HCL 50 MG/ML IJ SOLN
25.0000 mg | Freq: Once | INTRAMUSCULAR | Status: AC
  Administered 2015-09-01: 25 mg via INTRAVENOUS
  Filled 2015-09-01: qty 1

## 2015-09-01 NOTE — ED Provider Notes (Signed)
CSN: [id number]     Arrival date & time 09/01/15  4098 History   First MD Initiated Contact with Patient 09/01/15 [phone]     Chief Complaint  Patient presents with  . Nausea  . Emesis     (Consider location/radiation/quality/duration/timing/severity/associated sxs/prior Treatment) HPI   Pt is a 55 y/o female with hx of HTN, depression, asthma, HLD, presents to the ER via EMS with complaints of N, V, near syncope secondary to epi pen use today for concerns of allergic reaction.  Pt was given rx for prednisone on Monday for plantar fasciitis, states she developed hives and SOB on 3 days ago with associated itching, sneezing, and unchanged wheeziness.  She took benadryl on Wednesday w/o much relief.  Thursday took one pill from prednisone pack, still didn't feel well, felt itchy again, was "not breathing that well" administered epi pen 10 am Thursday at work, drove herself home.  Later in the day when she stood up, felt lightheaded, kept falling over, and had N, V, with diaphoresis, hot and cold chills.  Emesis described as non-bloody, non-bilious.  She denies associated abdominal pain, chest pain, SOB.  She states she recently ran out of gabapentin, no other med changes.  She denies HA, visual disturbances, numbness, tingling.  No other acute sx.  Past Medical History  Diagnosis Date  . Hypertension   . Depression   . Arthritis   . Asthma   . Hyperlipidemia   . Thyroid disease   . Infected sebaceous cyst, left buttocks. 04/22/2013   Past Surgical History  Procedure Laterality Date  . Penile adhesions lysis      near liver and female organs  . Nasal sinus surgery      3   Family History  Problem Relation Age of Onset  . Cancer Mother     kidney & lung  . Heart disease Father   . Stroke Father   . COPD Sister   . Hepatitis C Sister    Social History  Substance Use Topics  . Smoking status: Never Smoker   . Smokeless tobacco: Never Used  . Alcohol Use: No   OB History    No  data available     Review of Systems  All other systems reviewed and are negative.     Allergies  Keflex; Penicillins; and Sulfa antibiotics  Home Medications   Prior to Admission medications   Medication Sig Start Date End Date Taking? Authorizing Provider  ALPRAZolam Prudy Feeler) 1 MG tablet Take 1 mg by mouth 2 (two) times daily as needed. anxiety 04/25/15   Historical Provider, MD  azithromycin (ZITHROMAX Z-PAK) 250 MG tablet 2 tablets day 1;     1 tablet day 2 through 5 05/31/15   Donnetta Hutching, MD  cyclobenzaprine (FLEXERIL) 10 MG tablet Take 1 tablet (10 mg total) by mouth 2 (two) times daily as needed for muscle spasms. 05/31/15   Donnetta Hutching, MD  escitalopram (LEXAPRO) 10 MG tablet Take 10 mg by mouth daily.    Historical Provider, MD  escitalopram (LEXAPRO) 20 MG tablet Take 20 mg by mouth every morning.     Historical Provider, MD  gabapentin (NEURONTIN) 800 MG tablet Take 800 mg by mouth 2 (two) times daily.    Historical Provider, MD  ondansetron (ZOFRAN ODT) 4 MG disintegrating tablet Take 1 tablet (4 mg total) by mouth every 8 (eight) hours as needed for nausea or vomiting. 09/01/15   Danelle Berry, PA-C  SYMBICORT 160-4.5 MCG/ACT inhaler Inhale  2 puffs into the lungs at bedtime. 05/21/15   Historical Provider, MD   BP 146/84 mmHg  Pulse 85  Temp(Src) 97.8 F (36.6 C) (Oral)  Resp 21  SpO2 97% Physical Exam  Constitutional: She is oriented to person, place, and time. She appears well-developed and well-nourished. No distress.  HENT:  Head: Normocephalic and atraumatic.  Nose: Nose normal.  Mouth/Throat: Oropharynx is clear and moist. No oropharyngeal exudate.  Eyes: Conjunctivae and EOM are normal. Pupils are equal, round, and reactive to light. Right eye exhibits no discharge. Left eye exhibits no discharge. No scleral icterus.  Neck: Normal range of motion. No JVD present. No tracheal deviation present. No thyromegaly present.  Cardiovascular: Normal rate, regular rhythm,  normal heart sounds and intact distal pulses.  Exam reveals no gallop and no friction rub.   No murmur heard. Pulmonary/Chest: Effort normal and breath sounds normal. No respiratory distress. She has no wheezes. She has no rales. She exhibits no tenderness.  Abdominal: Soft. Bowel sounds are normal. She exhibits no distension and no mass. There is no tenderness. There is no rebound and no guarding.  Musculoskeletal: Normal range of motion. She exhibits no edema or tenderness.  Lymphadenopathy:    She has no cervical adenopathy.  Neurological: She is alert and oriented to person, place, and time. She has normal reflexes. No cranial nerve deficit. She exhibits normal muscle tone. Coordination normal.  Skin: Skin is warm and dry. No rash noted. She is not diaphoretic. No erythema. No pallor.  Psychiatric: She has a normal mood and affect. Her behavior is normal. Judgment and thought content normal.  Nursing note and vitals reviewed.   ED Course  Procedures (including critical care time) Labs Review Labs Reviewed  URINALYSIS, ROUTINE W REFLEX MICROSCOPIC (NOT AT [facility]) - Abnormal; Notable for the following:    APPearance CLOUDY (*)    All other components within normal limits  CBC WITH DIFFERENTIAL/PLATELET - Abnormal; Notable for the following:    Lymphs Abs 0.6 (*)    All other components within normal limits  COMPREHENSIVE METABOLIC PANEL - Abnormal; Notable for the following:    Chloride 100 (*)    Glucose, Bld 157 (*)    All other components within normal limits  TROPONIN I  LIPASE, BLOOD    Imaging Review No results found. I have personally reviewed and evaluated these images and lab results as part of my medical decision-making.   EKG Interpretation   Date/Time:  Friday September 01 2015 04:02:20 EDT Ventricular Rate:  76 PR Interval:  141 QRS Duration: 93 QT Interval:  431 QTC Calculation: 485 R Axis:   78 Text Interpretation:  Sinus rhythm Prominent P waves, nondiagnostic   Minimal ST elevation, inferior leads ED PHYSICIAN INTERPRETATION AVAILABLE  IN [facility] Confirmed by TEST, Record ([id number]) on 09/02/2015 10:01:40  AM      MDM   Pt with possible allergic reaction over the past week, self treated with epi yesterday at 10 am, since has N/V and near syncope and SOB, which she states she has a history of.  After being unable to stand up for most of the day, and having several episodes of vomiting, she called EMS.   In the ER she complains of nausea, hot and cold chills.  No abdominal pain, CP, SOB consistent with baseline.  Pt's vitals WNL, initially, she was unable to stand at the bedside due to dizziness, appeared orthostatic with just laying/sitting vitals.  Pt given IVF, basic labs,  troponin and EKG obtained.    Patient's labs were unremarkable.  She reported her nausea improved, she was able to ambulate without assistance to the restroom.  She had successful PO trial.  She was encouraged to discontinue prednisone and follow up today with PCP.  Work note provided.  Encouraged supportive treatment, antiemetic rx given.     Final diagnoses:  Non-intractable vomiting with nausea, vomiting of unspecified type  Near syncope       Danelle Berry, PA-C 09/25/15 1610  Azalia Bilis, MD 09/29/15 [phone]

## 2015-09-01 NOTE — ED Notes (Signed)
Bed: ZO10WA10 Expected date:  Expected time:  Means of arrival:  Comments: EMS N/V near syncope began Prednisone 3 days ago

## 2015-09-01 NOTE — Discharge Instructions (Signed)
Nausea and Vomiting °Nausea is a sick feeling that often comes before throwing up (vomiting). Vomiting is a reflex where stomach contents come out of your mouth. Vomiting can cause severe loss of body fluids (dehydration). Children and elderly adults can become dehydrated quickly, especially if they also have diarrhea. Nausea and vomiting are symptoms of a condition or disease. It is important to find the cause of your symptoms. °CAUSES  °· Direct irritation of the stomach lining. This irritation can result from increased acid production (gastroesophageal reflux disease), infection, food poisoning, taking certain medicines (such as nonsteroidal anti-inflammatory drugs), alcohol use, or tobacco use. °· Signals from the brain. These signals could be caused by a headache, heat exposure, an inner ear disturbance, increased pressure in the brain from injury, infection, a tumor, or a concussion, pain, emotional stimulus, or metabolic problems. °· An obstruction in the gastrointestinal tract (bowel obstruction). °· Illnesses such as diabetes, hepatitis, gallbladder problems, appendicitis, kidney problems, cancer, sepsis, atypical symptoms of a heart attack, or eating disorders. °· Medical treatments such as chemotherapy and radiation. °· Receiving medicine that makes you sleep (general anesthetic) during surgery. °DIAGNOSIS °Your caregiver may ask for tests to be done if the problems do not improve after a few days. Tests may also be done if symptoms are severe or if the reason for the nausea and vomiting is not clear. Tests may include: °· Urine tests. °· Blood tests. °· Stool tests. °· Cultures (to look for evidence of infection). °· X-rays or other imaging studies. °Test results can help your caregiver make decisions about treatment or the need for additional tests. °TREATMENT °You need to stay well hydrated. Drink frequently but in small amounts. You may wish to drink water, sports drinks, clear broth, or eat frozen  ice pops or gelatin dessert to help stay hydrated. When you eat, eating slowly may help prevent nausea. There are also some antinausea medicines that may help prevent nausea. °HOME CARE INSTRUCTIONS  °· Take all medicine as directed by your caregiver. °· If you do not have an appetite, do not force yourself to eat. However, you must continue to drink fluids. °· If you have an appetite, eat a normal diet unless your caregiver tells you differently. °· Eat a variety of complex carbohydrates (rice, wheat, potatoes, bread), lean meats, yogurt, fruits, and vegetables. °· Avoid high-fat foods because they are more difficult to digest. °· Drink enough water and fluids to keep your urine clear or pale yellow. °· If you are dehydrated, ask your caregiver for specific rehydration instructions. Signs of dehydration may include: °· Severe thirst. °· Dry lips and mouth. °· Dizziness. °· Dark urine. °· Decreasing urine frequency and amount. °· Confusion. °· Rapid breathing or pulse. °SEEK IMMEDIATE MEDICAL CARE IF:  °· You have blood or brown flecks (like coffee grounds) in your vomit. °· You have black or bloody stools. °· You have a severe headache or stiff neck. °· You are confused. °· You have severe abdominal pain. °· You have chest pain or trouble breathing. °· You do not urinate at least once every 8 hours. °· You develop cold or clammy skin. °· You continue to vomit for longer than 24 to 48 hours. °· You have a fever. °MAKE SURE YOU:  °· Understand these instructions. °· Will watch your condition. °· Will get help right away if you are not doing well or get worse. °  °This information is not intended to replace advice given to you by your health care provider. Make sure   you discuss any questions you have with your health care provider. °  °Document Released: 05/20/2005 Document Revised: 08/12/2011 Document Reviewed: 10/17/2010 °Elsevier Interactive Patient Education ©2016 [employer] ° °Near-Syncope °Near-syncope  (commonly known as near fainting) is sudden weakness, dizziness, or feeling like you might pass out. This can happen when getting up or while standing for a long time. It is caused by a sudden decrease in blood flow to the brain, which can occur for various reasons. Most of the reasons are not serious.  °HOME CARE °Watch your condition for any changes. °· Have someone stay with you until you feel stable. °· If you feel like you are going to pass out: °¨ Lie down right away. °¨ Prop your feet up if you can. °¨ Breathe deeply and steadily. °¨ Move only when the feeling has gone away. Most of the time, this feeling lasts only a few minutes. You may feel tired for several hours. °· Drink enough fluids to keep your pee (urine) clear or pale yellow. °· If you are taking blood pressure or heart medicine, stand up slowly. °· Follow up with your doctor as told. °GET HELP RIGHT AWAY IF:  °· You have a severe headache. °· You have unusual pain in the chest, belly (abdomen), or back. °· You have bleeding from the mouth or butt (rectum), or you have black or tarry poop (stool). °· You feel your heart beat differently than normal, or you have a very fast pulse. °· You pass out, or you twitch and shake when you pass out. °· You pass out when sitting or lying down. °· You feel confused. °· You have trouble walking. °· You are weak. °· You have vision problems. °MAKE SURE YOU:  °· Understand these instructions. °· Will watch your condition. °· Will get help right away if you are not doing well or get worse. °  °This information is not intended to replace advice given to you by your health care provider. Make sure you discuss any questions you have with your health care provider. °  °Document Released: 11/06/2007 Document Revised: 06/10/2014 Document Reviewed: 10/23/2012 °Elsevier Interactive Patient Education ©2016 [employer] ° °

## 2015-09-01 NOTE — ED Notes (Addendum)
Pt. Attempted orthostatic vital sign standing up, pt. started Feeling dizzy and redirected back to the bed. PA,Leisa made aware.

## 2015-09-01 NOTE — ED Notes (Signed)
Pt ambulated to RR unassisted 

## 2015-09-01 NOTE — ED Notes (Signed)
Pt ambulated to the BR with steady gait.   

## 2015-09-01 NOTE — ED Notes (Signed)
Patient reports being on prednisone for 3 days. Allergic reaction at 10am, injected Epi. Now complains of nausea, vomiting weakness after injection.

## 2015-09-01 NOTE — ED Notes (Addendum)
EKG given to EDP,Campos,MD., for review. 

## 2015-09-01 NOTE — ED Notes (Signed)
Pt tolerated a coke and saltines

## 2015-11-01 DIAGNOSIS — G5752 Tarsal tunnel syndrome, left lower limb: Secondary | ICD-10-CM | POA: Diagnosis not present

## 2015-11-01 DIAGNOSIS — M792 Neuralgia and neuritis, unspecified: Secondary | ICD-10-CM | POA: Diagnosis not present

## 2015-11-01 DIAGNOSIS — G609 Hereditary and idiopathic neuropathy, unspecified: Secondary | ICD-10-CM | POA: Diagnosis not present

## 2015-11-01 DIAGNOSIS — M722 Plantar fascial fibromatosis: Secondary | ICD-10-CM | POA: Diagnosis not present

## 2015-11-21 DIAGNOSIS — Z01419 Encounter for gynecological examination (general) (routine) without abnormal findings: Secondary | ICD-10-CM | POA: Diagnosis not present

## 2015-11-21 DIAGNOSIS — Z6841 Body Mass Index (BMI) 40.0 and over, adult: Secondary | ICD-10-CM | POA: Diagnosis not present

## 2015-11-21 DIAGNOSIS — Z1231 Encounter for screening mammogram for malignant neoplasm of breast: Secondary | ICD-10-CM | POA: Diagnosis not present

## 2015-12-28 DIAGNOSIS — G5752 Tarsal tunnel syndrome, left lower limb: Secondary | ICD-10-CM | POA: Diagnosis not present

## 2015-12-28 DIAGNOSIS — M722 Plantar fascial fibromatosis: Secondary | ICD-10-CM | POA: Diagnosis not present

## 2016-02-08 ENCOUNTER — Ambulatory Visit: Payer: BLUE CROSS/BLUE SHIELD | Admitting: Podiatry

## 2016-02-28 ENCOUNTER — Ambulatory Visit (INDEPENDENT_AMBULATORY_CARE_PROVIDER_SITE_OTHER): Payer: BLUE CROSS/BLUE SHIELD | Admitting: Podiatry

## 2016-02-28 ENCOUNTER — Encounter: Payer: Self-pay | Admitting: Podiatry

## 2016-02-28 VITALS — BP 152/102 | HR 72

## 2016-02-28 DIAGNOSIS — M2040 Other hammer toe(s) (acquired), unspecified foot: Secondary | ICD-10-CM

## 2016-02-28 DIAGNOSIS — M779 Enthesopathy, unspecified: Secondary | ICD-10-CM | POA: Diagnosis not present

## 2016-02-28 DIAGNOSIS — M21619 Bunion of unspecified foot: Secondary | ICD-10-CM | POA: Diagnosis not present

## 2016-02-28 MED ORDER — TRIAMCINOLONE ACETONIDE 10 MG/ML IJ SUSP: 10.0000 mg | Freq: Once | INTRAMUSCULAR | Status: DC

## 2016-02-28 MED ORDER — PREDNISONE 10 MG PO TABS: ORAL_TABLET | ORAL | 0 refills | Status: DC

## 2016-02-28 NOTE — Progress Notes (Signed)
   Subjective:    Patient ID: Deanna Mcguire, female    DOB: 02/19/1961, 55 y.o.   MRN: [id number]  HPI    Review of Systems  HENT: Positive for tinnitus.   Respiratory: Positive for cough and shortness of breath.   Cardiovascular: Positive for leg swelling.  All other systems reviewed and are negative.      Objective:   Physical Exam        Assessment & Plan:

## 2016-02-29 NOTE — Progress Notes (Signed)
Subjective:     Patient ID: Lynn ItoCathy L Jean, female   DOB: 12/19/1960, 55 y.o.   MRN: [id number]  HPI patient presents stating that she is developing a lot of discomfort around the ankle joint of both feet and also has discomfort with digital deformity noted and structural bunion deformity.   Review of Systems  All other systems reviewed and are negative.      Objective:   Physical Exam  Constitutional: She is oriented to person, place, and time.  Cardiovascular: Intact distal pulses.   Musculoskeletal: Normal range of motion.  Neurological: She is oriented to person, place, and time.  Skin: Skin is warm.  Nursing note and vitals reviewed.  neurovascular status found to be intact muscle strength was adequate with inflammatory changes around the sinus tarsi bilateral and moderate structural bunion deformity and digital deformities bilateral with elevation of the lesser digits. 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

## 2016-03-13 ENCOUNTER — Ambulatory Visit: Payer: BLUE CROSS/BLUE SHIELD | Admitting: Podiatry

## 2016-03-28 ENCOUNTER — Ambulatory Visit (INDEPENDENT_AMBULATORY_CARE_PROVIDER_SITE_OTHER): Payer: BLUE CROSS/BLUE SHIELD | Admitting: Podiatry

## 2016-03-28 DIAGNOSIS — M779 Enthesopathy, unspecified: Secondary | ICD-10-CM | POA: Diagnosis not present

## 2016-03-28 DIAGNOSIS — M21619 Bunion of unspecified foot: Secondary | ICD-10-CM | POA: Diagnosis not present

## 2016-03-28 MED ORDER — DICLOFENAC SODIUM 75 MG PO TBEC: 75.0000 mg | DELAYED_RELEASE_TABLET | Freq: Two times a day (BID) | ORAL | 2 refills | Status: DC

## 2016-04-07 NOTE — Progress Notes (Signed)
Subjective:     Patient ID: Deanna Mcguire, female   DOB: 07/05/1960, 55 y.o.   MRN: [id number]  HPI patient presents stating that she seems to be improved with patient still noted to have chronic bunion deformity that's not as symptomatic and also states her forefoot is feeling better   Review of Systems     Objective:   Physical Exam Neurovascular status intact muscle strength adequate with patient's foot continuing to improve with pain still present upon deep palpation    Assessment:     Doing well post treatment with mild discomfort noted localized in nature with structural bunion deformity    Plan:     H&P condition reviewed and recommended continued conservative care with wider-type shoes and padding therapy. Patient will be seen back for us to recheck again as needed

## 2016-05-14 DIAGNOSIS — J3081 Allergic rhinitis due to animal (cat) (dog) hair and dander: Secondary | ICD-10-CM | POA: Diagnosis not present

## 2016-05-14 DIAGNOSIS — J301 Allergic rhinitis due to pollen: Secondary | ICD-10-CM | POA: Diagnosis not present

## 2016-05-14 DIAGNOSIS — J3089 Other allergic rhinitis: Secondary | ICD-10-CM | POA: Diagnosis not present

## 2016-05-14 DIAGNOSIS — J4540 Moderate persistent asthma, uncomplicated: Secondary | ICD-10-CM | POA: Diagnosis not present

## 2016-05-24 DIAGNOSIS — J3081 Allergic rhinitis due to animal (cat) (dog) hair and dander: Secondary | ICD-10-CM | POA: Diagnosis not present

## 2016-05-24 DIAGNOSIS — J301 Allergic rhinitis due to pollen: Secondary | ICD-10-CM | POA: Diagnosis not present

## 2016-05-28 DIAGNOSIS — J3089 Other allergic rhinitis: Secondary | ICD-10-CM | POA: Diagnosis not present

## 2016-07-01 ENCOUNTER — Ambulatory Visit: Payer: BLUE CROSS/BLUE SHIELD | Admitting: Podiatry

## 2016-07-02 DIAGNOSIS — J3089 Other allergic rhinitis: Secondary | ICD-10-CM | POA: Diagnosis not present

## 2016-07-02 DIAGNOSIS — J3081 Allergic rhinitis due to animal (cat) (dog) hair and dander: Secondary | ICD-10-CM | POA: Diagnosis not present

## 2016-07-02 DIAGNOSIS — J301 Allergic rhinitis due to pollen: Secondary | ICD-10-CM | POA: Diagnosis not present

## 2016-07-09 DIAGNOSIS — J3081 Allergic rhinitis due to animal (cat) (dog) hair and dander: Secondary | ICD-10-CM | POA: Diagnosis not present

## 2016-07-09 DIAGNOSIS — J301 Allergic rhinitis due to pollen: Secondary | ICD-10-CM | POA: Diagnosis not present

## 2016-07-09 DIAGNOSIS — J3089 Other allergic rhinitis: Secondary | ICD-10-CM | POA: Diagnosis not present

## 2016-07-16 DIAGNOSIS — J3089 Other allergic rhinitis: Secondary | ICD-10-CM | POA: Diagnosis not present

## 2016-07-16 DIAGNOSIS — J3081 Allergic rhinitis due to animal (cat) (dog) hair and dander: Secondary | ICD-10-CM | POA: Diagnosis not present

## 2016-07-16 DIAGNOSIS — J301 Allergic rhinitis due to pollen: Secondary | ICD-10-CM | POA: Diagnosis not present

## 2016-07-24 DIAGNOSIS — J3089 Other allergic rhinitis: Secondary | ICD-10-CM | POA: Diagnosis not present

## 2016-07-24 DIAGNOSIS — J301 Allergic rhinitis due to pollen: Secondary | ICD-10-CM | POA: Diagnosis not present

## 2016-07-24 DIAGNOSIS — J3081 Allergic rhinitis due to animal (cat) (dog) hair and dander: Secondary | ICD-10-CM | POA: Diagnosis not present

## 2016-07-31 DIAGNOSIS — J3089 Other allergic rhinitis: Secondary | ICD-10-CM | POA: Diagnosis not present

## 2016-07-31 DIAGNOSIS — J3081 Allergic rhinitis due to animal (cat) (dog) hair and dander: Secondary | ICD-10-CM | POA: Diagnosis not present

## 2016-07-31 DIAGNOSIS — J301 Allergic rhinitis due to pollen: Secondary | ICD-10-CM | POA: Diagnosis not present

## 2016-08-07 DIAGNOSIS — J301 Allergic rhinitis due to pollen: Secondary | ICD-10-CM | POA: Diagnosis not present

## 2016-08-07 DIAGNOSIS — J3089 Other allergic rhinitis: Secondary | ICD-10-CM | POA: Diagnosis not present

## 2016-08-07 DIAGNOSIS — J3081 Allergic rhinitis due to animal (cat) (dog) hair and dander: Secondary | ICD-10-CM | POA: Diagnosis not present

## 2016-08-14 DIAGNOSIS — J301 Allergic rhinitis due to pollen: Secondary | ICD-10-CM | POA: Diagnosis not present

## 2016-08-14 DIAGNOSIS — J3081 Allergic rhinitis due to animal (cat) (dog) hair and dander: Secondary | ICD-10-CM | POA: Diagnosis not present

## 2016-08-14 DIAGNOSIS — J3089 Other allergic rhinitis: Secondary | ICD-10-CM | POA: Diagnosis not present

## 2016-08-21 DIAGNOSIS — J3081 Allergic rhinitis due to animal (cat) (dog) hair and dander: Secondary | ICD-10-CM | POA: Diagnosis not present

## 2016-08-21 DIAGNOSIS — J3089 Other allergic rhinitis: Secondary | ICD-10-CM | POA: Diagnosis not present

## 2016-08-21 DIAGNOSIS — J301 Allergic rhinitis due to pollen: Secondary | ICD-10-CM | POA: Diagnosis not present

## 2016-08-27 ENCOUNTER — Ambulatory Visit (INDEPENDENT_AMBULATORY_CARE_PROVIDER_SITE_OTHER): Payer: BLUE CROSS/BLUE SHIELD | Admitting: Podiatry

## 2016-08-27 DIAGNOSIS — M779 Enthesopathy, unspecified: Secondary | ICD-10-CM | POA: Diagnosis not present

## 2016-08-27 MED ORDER — TRIAMCINOLONE ACETONIDE 10 MG/ML IJ SUSP: 10.0000 mg | Freq: Once | INTRAMUSCULAR | Status: DC

## 2016-08-27 MED ORDER — TRIAMCINOLONE ACETONIDE 10 MG/ML IJ SUSP
10.0000 mg | Freq: Once | INTRAMUSCULAR | Status: AC
  Administered 2016-08-27: 10 mg

## 2016-08-27 MED ORDER — PREDNISONE 10 MG PO TABS: ORAL_TABLET | ORAL | 0 refills | Status: DC

## 2016-08-28 DIAGNOSIS — J3081 Allergic rhinitis due to animal (cat) (dog) hair and dander: Secondary | ICD-10-CM | POA: Diagnosis not present

## 2016-08-28 DIAGNOSIS — J3089 Other allergic rhinitis: Secondary | ICD-10-CM | POA: Diagnosis not present

## 2016-08-28 DIAGNOSIS — J301 Allergic rhinitis due to pollen: Secondary | ICD-10-CM | POA: Diagnosis not present

## 2016-08-28 NOTE — Progress Notes (Signed)
Subjective:     Patient ID: Deanna Mcguire, female   DOB: 08/28/1960, 56 y.o.   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

## 2016-09-04 DIAGNOSIS — J3081 Allergic rhinitis due to animal (cat) (dog) hair and dander: Secondary | ICD-10-CM | POA: Diagnosis not present

## 2016-09-04 DIAGNOSIS — J301 Allergic rhinitis due to pollen: Secondary | ICD-10-CM | POA: Diagnosis not present

## 2016-09-04 DIAGNOSIS — J3089 Other allergic rhinitis: Secondary | ICD-10-CM | POA: Diagnosis not present

## 2016-09-11 DIAGNOSIS — J3089 Other allergic rhinitis: Secondary | ICD-10-CM | POA: Diagnosis not present

## 2016-09-11 DIAGNOSIS — J3081 Allergic rhinitis due to animal (cat) (dog) hair and dander: Secondary | ICD-10-CM | POA: Diagnosis not present

## 2016-09-11 DIAGNOSIS — J301 Allergic rhinitis due to pollen: Secondary | ICD-10-CM | POA: Diagnosis not present

## 2016-09-18 DIAGNOSIS — J301 Allergic rhinitis due to pollen: Secondary | ICD-10-CM | POA: Diagnosis not present

## 2016-09-18 DIAGNOSIS — J3081 Allergic rhinitis due to animal (cat) (dog) hair and dander: Secondary | ICD-10-CM | POA: Diagnosis not present

## 2016-09-18 DIAGNOSIS — J3089 Other allergic rhinitis: Secondary | ICD-10-CM | POA: Diagnosis not present

## 2016-09-25 DIAGNOSIS — J3081 Allergic rhinitis due to animal (cat) (dog) hair and dander: Secondary | ICD-10-CM | POA: Diagnosis not present

## 2016-09-25 DIAGNOSIS — J3089 Other allergic rhinitis: Secondary | ICD-10-CM | POA: Diagnosis not present

## 2016-09-25 DIAGNOSIS — J301 Allergic rhinitis due to pollen: Secondary | ICD-10-CM | POA: Diagnosis not present

## 2016-10-03 DIAGNOSIS — J301 Allergic rhinitis due to pollen: Secondary | ICD-10-CM | POA: Diagnosis not present

## 2016-10-03 DIAGNOSIS — J3081 Allergic rhinitis due to animal (cat) (dog) hair and dander: Secondary | ICD-10-CM | POA: Diagnosis not present

## 2016-10-03 DIAGNOSIS — J3089 Other allergic rhinitis: Secondary | ICD-10-CM | POA: Diagnosis not present

## 2016-10-09 DIAGNOSIS — J3081 Allergic rhinitis due to animal (cat) (dog) hair and dander: Secondary | ICD-10-CM | POA: Diagnosis not present

## 2016-10-09 DIAGNOSIS — J301 Allergic rhinitis due to pollen: Secondary | ICD-10-CM | POA: Diagnosis not present

## 2016-10-09 DIAGNOSIS — J3089 Other allergic rhinitis: Secondary | ICD-10-CM | POA: Diagnosis not present

## 2016-10-16 DIAGNOSIS — J3081 Allergic rhinitis due to animal (cat) (dog) hair and dander: Secondary | ICD-10-CM | POA: Diagnosis not present

## 2016-10-16 DIAGNOSIS — J3089 Other allergic rhinitis: Secondary | ICD-10-CM | POA: Diagnosis not present

## 2016-10-16 DIAGNOSIS — J301 Allergic rhinitis due to pollen: Secondary | ICD-10-CM | POA: Diagnosis not present

## 2016-10-23 DIAGNOSIS — J3089 Other allergic rhinitis: Secondary | ICD-10-CM | POA: Diagnosis not present

## 2016-10-23 DIAGNOSIS — J3081 Allergic rhinitis due to animal (cat) (dog) hair and dander: Secondary | ICD-10-CM | POA: Diagnosis not present

## 2016-10-23 DIAGNOSIS — J301 Allergic rhinitis due to pollen: Secondary | ICD-10-CM | POA: Diagnosis not present

## 2016-10-29 DIAGNOSIS — Z1231 Encounter for screening mammogram for malignant neoplasm of breast: Secondary | ICD-10-CM | POA: Diagnosis not present

## 2016-10-29 DIAGNOSIS — Z01419 Encounter for gynecological examination (general) (routine) without abnormal findings: Secondary | ICD-10-CM | POA: Diagnosis not present

## 2016-10-29 DIAGNOSIS — Z6841 Body Mass Index (BMI) 40.0 and over, adult: Secondary | ICD-10-CM | POA: Diagnosis not present

## 2016-10-30 DIAGNOSIS — J3089 Other allergic rhinitis: Secondary | ICD-10-CM | POA: Diagnosis not present

## 2016-10-30 DIAGNOSIS — J301 Allergic rhinitis due to pollen: Secondary | ICD-10-CM | POA: Diagnosis not present

## 2016-10-30 DIAGNOSIS — J3081 Allergic rhinitis due to animal (cat) (dog) hair and dander: Secondary | ICD-10-CM | POA: Diagnosis not present

## 2016-11-06 DIAGNOSIS — J301 Allergic rhinitis due to pollen: Secondary | ICD-10-CM | POA: Diagnosis not present

## 2016-11-06 DIAGNOSIS — J3089 Other allergic rhinitis: Secondary | ICD-10-CM | POA: Diagnosis not present

## 2016-11-06 DIAGNOSIS — J3081 Allergic rhinitis due to animal (cat) (dog) hair and dander: Secondary | ICD-10-CM | POA: Diagnosis not present

## 2016-11-13 DIAGNOSIS — J3089 Other allergic rhinitis: Secondary | ICD-10-CM | POA: Diagnosis not present

## 2016-11-13 DIAGNOSIS — J301 Allergic rhinitis due to pollen: Secondary | ICD-10-CM | POA: Diagnosis not present

## 2016-11-13 DIAGNOSIS — J3081 Allergic rhinitis due to animal (cat) (dog) hair and dander: Secondary | ICD-10-CM | POA: Diagnosis not present

## 2016-11-20 DIAGNOSIS — J3089 Other allergic rhinitis: Secondary | ICD-10-CM | POA: Diagnosis not present

## 2016-11-20 DIAGNOSIS — J3081 Allergic rhinitis due to animal (cat) (dog) hair and dander: Secondary | ICD-10-CM | POA: Diagnosis not present

## 2016-11-20 DIAGNOSIS — J301 Allergic rhinitis due to pollen: Secondary | ICD-10-CM | POA: Diagnosis not present

## 2016-11-27 DIAGNOSIS — J3081 Allergic rhinitis due to animal (cat) (dog) hair and dander: Secondary | ICD-10-CM | POA: Diagnosis not present

## 2016-11-27 DIAGNOSIS — J301 Allergic rhinitis due to pollen: Secondary | ICD-10-CM | POA: Diagnosis not present

## 2016-11-27 DIAGNOSIS — J4540 Moderate persistent asthma, uncomplicated: Secondary | ICD-10-CM | POA: Diagnosis not present

## 2016-11-27 DIAGNOSIS — J3089 Other allergic rhinitis: Secondary | ICD-10-CM | POA: Diagnosis not present

## 2016-12-03 DIAGNOSIS — J3089 Other allergic rhinitis: Secondary | ICD-10-CM | POA: Diagnosis not present

## 2016-12-03 DIAGNOSIS — J301 Allergic rhinitis due to pollen: Secondary | ICD-10-CM | POA: Diagnosis not present

## 2016-12-03 DIAGNOSIS — J3081 Allergic rhinitis due to animal (cat) (dog) hair and dander: Secondary | ICD-10-CM | POA: Diagnosis not present

## 2016-12-06 ENCOUNTER — Encounter: Payer: Self-pay | Admitting: Podiatry

## 2016-12-06 ENCOUNTER — Ambulatory Visit (INDEPENDENT_AMBULATORY_CARE_PROVIDER_SITE_OTHER): Payer: BLUE CROSS/BLUE SHIELD | Admitting: Podiatry

## 2016-12-06 DIAGNOSIS — M779 Enthesopathy, unspecified: Secondary | ICD-10-CM | POA: Diagnosis not present

## 2016-12-06 MED ORDER — TRIAMCINOLONE ACETONIDE 10 MG/ML IJ SUSP
10.0000 mg | Freq: Once | INTRAMUSCULAR | Status: AC
  Administered 2016-12-06: 10 mg

## 2016-12-06 NOTE — Progress Notes (Signed)
Subjective:    Patient ID: Deanna Mcguire, female   DOB: 56 y.o.   MRN: [id number]   HPI Patient states that she is improved and the medication helps but it wears off after about 3 months. 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

## 2016-12-09 ENCOUNTER — Telehealth: Payer: Self-pay | Admitting: Podiatry

## 2016-12-09 MED ORDER — DICLOFENAC SODIUM 75 MG PO TBEC: 75.0000 mg | DELAYED_RELEASE_TABLET | Freq: Two times a day (BID) | ORAL | 0 refills | Status: DC

## 2016-12-09 NOTE — Telephone Encounter (Signed)
Pt called and left voicemail Friday afternoon asking about a medication for her feet. She said he had given her injection 7.6.18 but she was still having a lot of pain and could hardly walk.

## 2016-12-09 NOTE — Telephone Encounter (Signed)
Dr. Everlena Cooperegal okayed the refill of the Voltaren and should make an follow up appt. Orders to pt and [id number].

## 2016-12-11 DIAGNOSIS — J3089 Other allergic rhinitis: Secondary | ICD-10-CM | POA: Diagnosis not present

## 2016-12-11 DIAGNOSIS — J301 Allergic rhinitis due to pollen: Secondary | ICD-10-CM | POA: Diagnosis not present

## 2016-12-11 DIAGNOSIS — J3081 Allergic rhinitis due to animal (cat) (dog) hair and dander: Secondary | ICD-10-CM | POA: Diagnosis not present

## 2016-12-18 DIAGNOSIS — J3089 Other allergic rhinitis: Secondary | ICD-10-CM | POA: Diagnosis not present

## 2016-12-18 DIAGNOSIS — J3081 Allergic rhinitis due to animal (cat) (dog) hair and dander: Secondary | ICD-10-CM | POA: Diagnosis not present

## 2016-12-18 DIAGNOSIS — J301 Allergic rhinitis due to pollen: Secondary | ICD-10-CM | POA: Diagnosis not present

## 2016-12-25 DIAGNOSIS — J3081 Allergic rhinitis due to animal (cat) (dog) hair and dander: Secondary | ICD-10-CM | POA: Diagnosis not present

## 2016-12-25 DIAGNOSIS — J301 Allergic rhinitis due to pollen: Secondary | ICD-10-CM | POA: Diagnosis not present

## 2016-12-25 DIAGNOSIS — J3089 Other allergic rhinitis: Secondary | ICD-10-CM | POA: Diagnosis not present

## 2016-12-27 ENCOUNTER — Ambulatory Visit: Payer: BLUE CROSS/BLUE SHIELD | Admitting: Podiatry

## 2017-01-02 DIAGNOSIS — J3089 Other allergic rhinitis: Secondary | ICD-10-CM | POA: Diagnosis not present

## 2017-01-02 DIAGNOSIS — J301 Allergic rhinitis due to pollen: Secondary | ICD-10-CM | POA: Diagnosis not present

## 2017-01-02 DIAGNOSIS — J3081 Allergic rhinitis due to animal (cat) (dog) hair and dander: Secondary | ICD-10-CM | POA: Diagnosis not present

## 2017-01-06 DIAGNOSIS — J301 Allergic rhinitis due to pollen: Secondary | ICD-10-CM | POA: Diagnosis not present

## 2017-01-06 DIAGNOSIS — J3081 Allergic rhinitis due to animal (cat) (dog) hair and dander: Secondary | ICD-10-CM | POA: Diagnosis not present

## 2017-01-09 DIAGNOSIS — J301 Allergic rhinitis due to pollen: Secondary | ICD-10-CM | POA: Diagnosis not present

## 2017-01-09 DIAGNOSIS — J4540 Moderate persistent asthma, uncomplicated: Secondary | ICD-10-CM | POA: Diagnosis not present

## 2017-01-09 DIAGNOSIS — J3089 Other allergic rhinitis: Secondary | ICD-10-CM | POA: Diagnosis not present

## 2017-01-09 DIAGNOSIS — J209 Acute bronchitis, unspecified: Secondary | ICD-10-CM | POA: Diagnosis not present

## 2017-01-31 ENCOUNTER — Other Ambulatory Visit: Payer: Self-pay | Admitting: Allergy

## 2017-01-31 ENCOUNTER — Ambulatory Visit
Admission: RE | Admit: 2017-01-31 | Discharge: 2017-01-31 | Disposition: A | Discharge status: Home / Self Care | Payer: BLUE CROSS/BLUE SHIELD | Source: Ambulatory Visit | Attending: Allergy | Admitting: Allergy

## 2017-01-31 DIAGNOSIS — J219 Acute bronchiolitis, unspecified: Secondary | ICD-10-CM | POA: Diagnosis not present

## 2017-01-31 DIAGNOSIS — R05 Cough: Secondary | ICD-10-CM | POA: Diagnosis not present

## 2017-01-31 DIAGNOSIS — J301 Allergic rhinitis due to pollen: Secondary | ICD-10-CM | POA: Diagnosis not present

## 2017-01-31 DIAGNOSIS — J4540 Moderate persistent asthma, uncomplicated: Secondary | ICD-10-CM | POA: Diagnosis not present

## 2017-01-31 DIAGNOSIS — J019 Acute sinusitis, unspecified: Secondary | ICD-10-CM | POA: Diagnosis not present

## 2017-03-14 ENCOUNTER — Ambulatory Visit (INDEPENDENT_AMBULATORY_CARE_PROVIDER_SITE_OTHER): Payer: BLUE CROSS/BLUE SHIELD

## 2017-03-14 ENCOUNTER — Ambulatory Visit (INDEPENDENT_AMBULATORY_CARE_PROVIDER_SITE_OTHER): Payer: BLUE CROSS/BLUE SHIELD | Admitting: Podiatry

## 2017-03-14 ENCOUNTER — Ambulatory Visit: Payer: BLUE CROSS/BLUE SHIELD | Admitting: Podiatry

## 2017-03-14 DIAGNOSIS — M722 Plantar fascial fibromatosis: Secondary | ICD-10-CM | POA: Diagnosis not present

## 2017-03-14 DIAGNOSIS — M779 Enthesopathy, unspecified: Secondary | ICD-10-CM | POA: Diagnosis not present

## 2017-03-14 MED ORDER — TRIAMCINOLONE ACETONIDE 10 MG/ML IJ SUSP
10.0000 mg | Freq: Once | INTRAMUSCULAR | Status: AC
  Administered 2017-03-14: 10 mg

## 2017-03-14 NOTE — Progress Notes (Signed)
Subjective:    Patient ID: Deanna Mcguire, female   DOB: 56 y.o.   MRN: [id number]   HPI patient presents stating her ankle joints it started to hurt again and her right heel has become very sore and her orthotics have worn out and she no she needs a new orthotic she's trying to get more active to lose weight    ROS      Objective:  Physical Exam neurovascular status intact with patient's sinus tarsi bilateral being quite inflamed with fluid buildup and on the right plantar heel there is quite a bit of inflammation and fluid around the medial band. Patient is moderate depression of the arch which is also future contributory to her pain     Assessment:    Acute plantar fasciitis right with inflammatory capsulitis of the sinus tarsi bilateral     Plan:    H&P condition reviewed and today I went ahead and I injected the sinus tarsi bilateral 3 mg Kenalog 5 mg Xylocaine and injected the plantar heel 3 mg Kenalog 5 mg Xylocaine right debrided lesion and then went ahead and scanned for new orthotics to lift up plantar arch. Reappoint to recheck

## 2017-04-07 ENCOUNTER — Encounter: Payer: BLUE CROSS/BLUE SHIELD | Admitting: Orthotics

## 2017-04-09 ENCOUNTER — Ambulatory Visit: Payer: BLUE CROSS/BLUE SHIELD | Admitting: Orthotics

## 2017-04-09 DIAGNOSIS — M2040 Other hammer toe(s) (acquired), unspecified foot: Secondary | ICD-10-CM

## 2017-04-09 DIAGNOSIS — M722 Plantar fascial fibromatosis: Secondary | ICD-10-CM

## 2017-04-09 DIAGNOSIS — M779 Enthesopathy, unspecified: Secondary | ICD-10-CM

## 2017-04-09 DIAGNOSIS — M21619 Bunion of unspecified foot: Secondary | ICD-10-CM

## 2017-04-09 NOTE — Progress Notes (Signed)
Patient came in today to pick up custom made foot orthotics.  The goals were accomplished and the patient reported no dissatisfaction with said orthotics.  Patient was advised of breakin period and how to report any issues. 

## 2017-06-30 DIAGNOSIS — J3081 Allergic rhinitis due to animal (cat) (dog) hair and dander: Secondary | ICD-10-CM | POA: Diagnosis not present

## 2017-06-30 DIAGNOSIS — J3089 Other allergic rhinitis: Secondary | ICD-10-CM | POA: Diagnosis not present

## 2017-06-30 DIAGNOSIS — J301 Allergic rhinitis due to pollen: Secondary | ICD-10-CM | POA: Diagnosis not present

## 2017-06-30 DIAGNOSIS — J4540 Moderate persistent asthma, uncomplicated: Secondary | ICD-10-CM | POA: Diagnosis not present

## 2017-08-29 ENCOUNTER — Ambulatory Visit: Payer: BLUE CROSS/BLUE SHIELD | Admitting: Podiatry

## 2017-08-29 ENCOUNTER — Encounter: Payer: Self-pay | Admitting: Podiatry

## 2017-08-29 DIAGNOSIS — M25579 Pain in unspecified ankle and joints of unspecified foot: Secondary | ICD-10-CM | POA: Diagnosis not present

## 2017-08-29 MED ORDER — TRIAMCINOLONE ACETONIDE 10 MG/ML IJ SUSP
10.0000 mg | Freq: Once | INTRAMUSCULAR | Status: AC
  Administered 2017-08-29: 10 mg

## 2017-08-29 NOTE — Progress Notes (Signed)
Subjective:   Patient ID: Deanna Mcguire, female   DOB: 57 y.o.   MRN: [id number]   HPI Patient presents with inflammation pain of the sinus tarsi bilateral with intense discomfort upon palpation inflammatory capsulitis sinus tarsi bilateral with pain   ROS      Objective:  Physical Exam  Pain to the sinus tarsi bilateral with fluid buildup within the sinus tarsi     Assessment:  Inflammatory sinus tarsitis bilateral     Plan:  Injection of the capsule 3 mg Kenalog 5 mg Xylocaine bilateral was tolerated well

## 2017-09-15 DIAGNOSIS — Z6841 Body Mass Index (BMI) 40.0 and over, adult: Secondary | ICD-10-CM | POA: Diagnosis not present

## 2017-09-15 DIAGNOSIS — L988 Other specified disorders of the skin and subcutaneous tissue: Secondary | ICD-10-CM | POA: Diagnosis not present

## 2017-10-01 DIAGNOSIS — J4540 Moderate persistent asthma, uncomplicated: Secondary | ICD-10-CM | POA: Diagnosis not present

## 2017-10-01 DIAGNOSIS — J3089 Other allergic rhinitis: Secondary | ICD-10-CM | POA: Diagnosis not present

## 2017-10-01 DIAGNOSIS — J301 Allergic rhinitis due to pollen: Secondary | ICD-10-CM | POA: Diagnosis not present

## 2017-10-01 DIAGNOSIS — J3081 Allergic rhinitis due to animal (cat) (dog) hair and dander: Secondary | ICD-10-CM | POA: Diagnosis not present

## 2017-10-26 IMAGING — CR DG CHEST 2V
2 series · 2 of 2 positions shown · non-contrast
Comparison: 05/31/2015

CLINICAL DATA: Cough, shortness of Breath

EXAM:
CHEST  2 VIEW

[w chest pa]
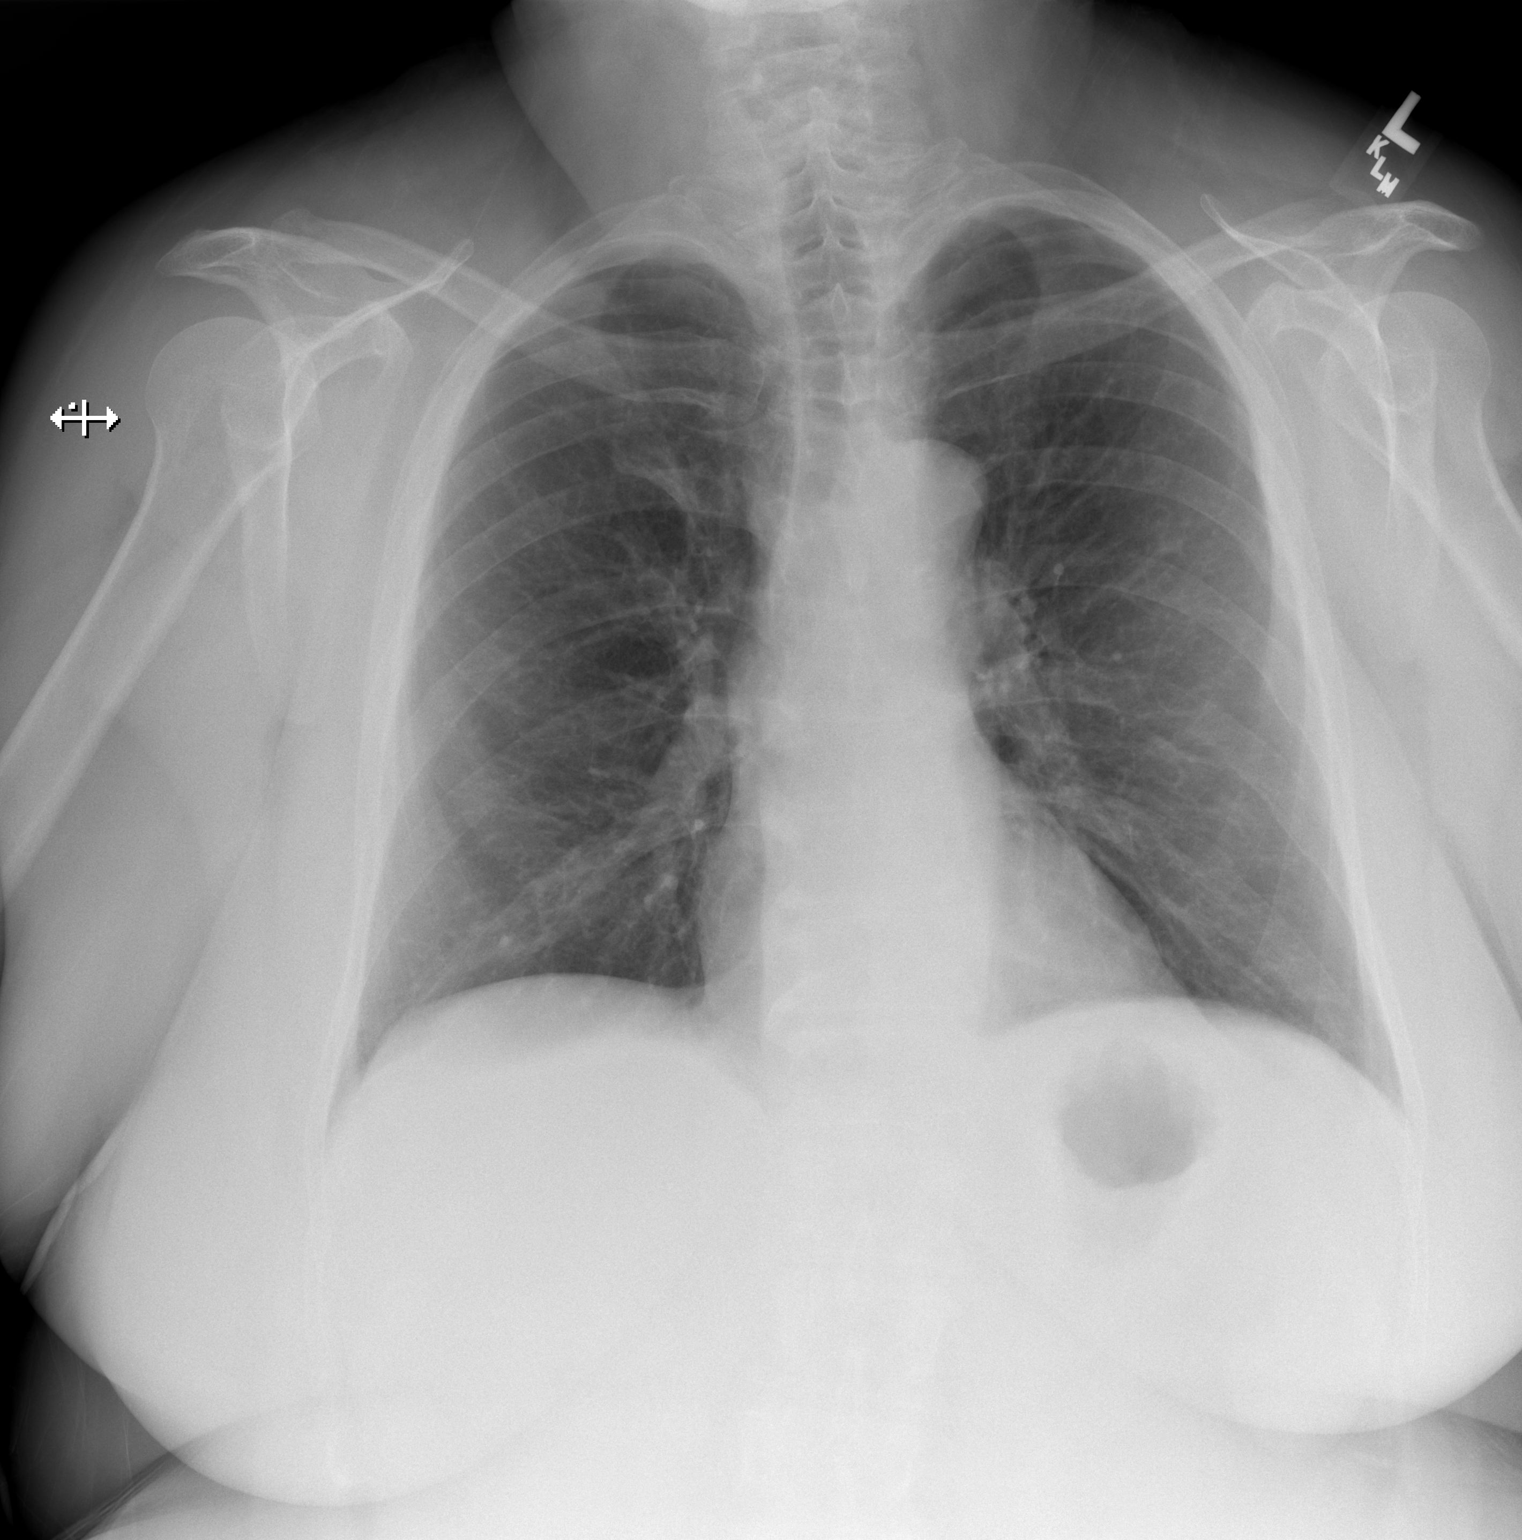

[w chest lat]
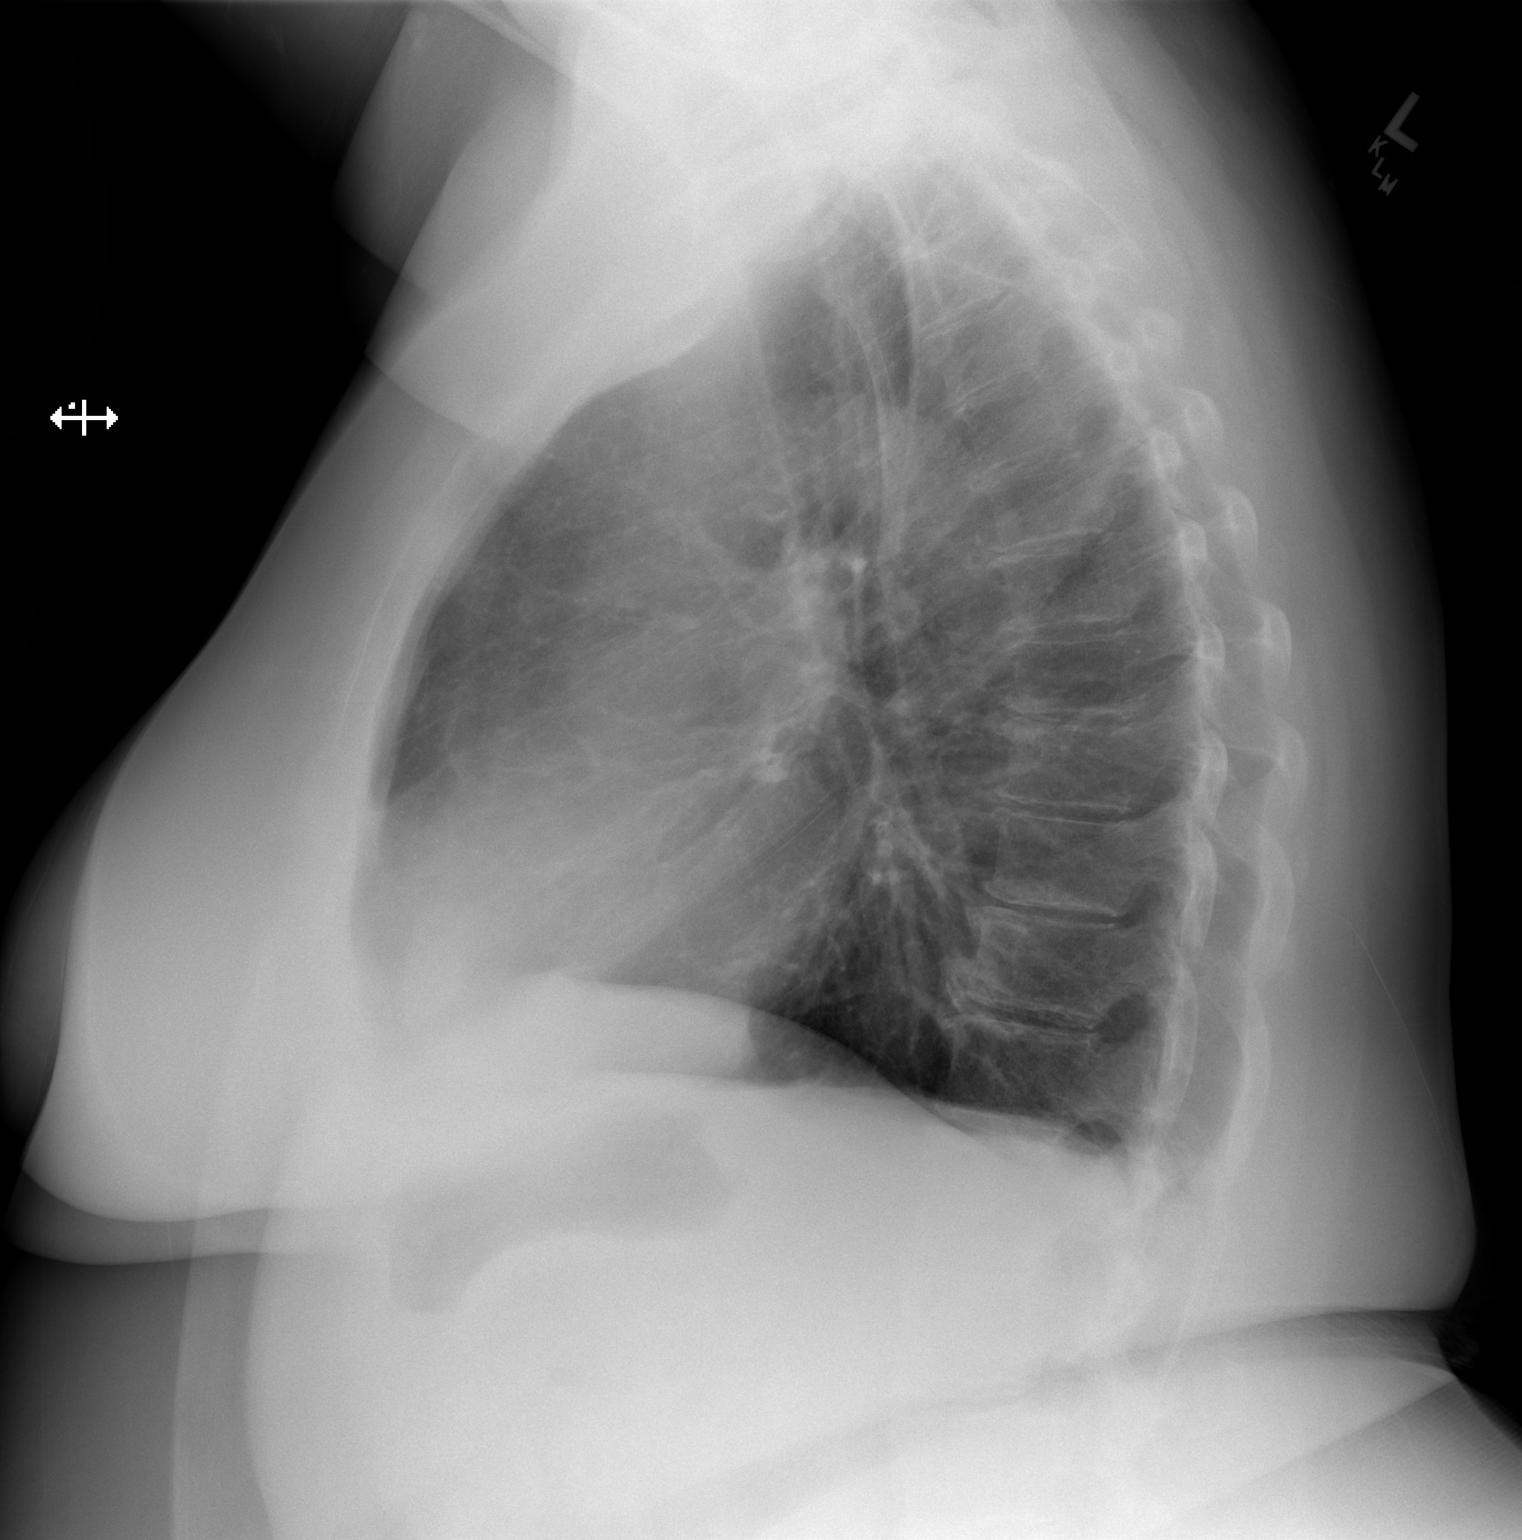

[2 of 2 positions shown; findings below may reference images not displayed]

FINDINGS: Heart and mediastinal contours are within normal limits. No focal
opacities or effusions. No acute bony abnormality.
IMPRESSION: No active cardiopulmonary disease.

## 2017-10-31 DIAGNOSIS — Z1231 Encounter for screening mammogram for malignant neoplasm of breast: Secondary | ICD-10-CM | POA: Diagnosis not present

## 2017-10-31 DIAGNOSIS — Z6841 Body Mass Index (BMI) 40.0 and over, adult: Secondary | ICD-10-CM | POA: Diagnosis not present

## 2017-10-31 DIAGNOSIS — Z01419 Encounter for gynecological examination (general) (routine) without abnormal findings: Secondary | ICD-10-CM | POA: Diagnosis not present

## 2017-12-26 ENCOUNTER — Ambulatory Visit: Payer: BLUE CROSS/BLUE SHIELD | Admitting: Podiatry

## 2017-12-26 ENCOUNTER — Encounter: Payer: Self-pay | Admitting: Podiatry

## 2017-12-26 DIAGNOSIS — M779 Enthesopathy, unspecified: Secondary | ICD-10-CM

## 2017-12-26 MED ORDER — TRIAMCINOLONE ACETONIDE 10 MG/ML IJ SUSP
10.0000 mg | Freq: Once | INTRAMUSCULAR | Status: AC
  Administered 2017-12-26: 10 mg

## 2017-12-27 NOTE — Progress Notes (Signed)
Subjective:   Patient ID: Deanna Mcguire, female   DOB: 57 y.o.   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

## 2018-07-24 DIAGNOSIS — G8929 Other chronic pain: Secondary | ICD-10-CM | POA: Diagnosis not present

## 2018-07-24 DIAGNOSIS — R42 Dizziness and giddiness: Secondary | ICD-10-CM | POA: Diagnosis not present

## 2018-07-24 DIAGNOSIS — H6502 Acute serous otitis media, left ear: Secondary | ICD-10-CM | POA: Diagnosis not present

## 2018-07-24 DIAGNOSIS — M542 Cervicalgia: Secondary | ICD-10-CM | POA: Diagnosis not present

## 2018-08-13 DIAGNOSIS — J45909 Unspecified asthma, uncomplicated: Secondary | ICD-10-CM | POA: Diagnosis not present

## 2018-08-13 DIAGNOSIS — Z6841 Body Mass Index (BMI) 40.0 and over, adult: Secondary | ICD-10-CM | POA: Diagnosis not present

## 2018-08-13 DIAGNOSIS — J309 Allergic rhinitis, unspecified: Secondary | ICD-10-CM | POA: Diagnosis not present

## 2018-08-19 DIAGNOSIS — J301 Allergic rhinitis due to pollen: Secondary | ICD-10-CM | POA: Diagnosis not present

## 2018-08-19 DIAGNOSIS — J3081 Allergic rhinitis due to animal (cat) (dog) hair and dander: Secondary | ICD-10-CM | POA: Diagnosis not present

## 2018-08-19 DIAGNOSIS — J4540 Moderate persistent asthma, uncomplicated: Secondary | ICD-10-CM | POA: Diagnosis not present

## 2018-08-19 DIAGNOSIS — J3089 Other allergic rhinitis: Secondary | ICD-10-CM | POA: Diagnosis not present

## 2018-10-09 DIAGNOSIS — J209 Acute bronchitis, unspecified: Secondary | ICD-10-CM | POA: Diagnosis not present

## 2018-10-09 DIAGNOSIS — Z20828 Contact with and (suspected) exposure to other viral communicable diseases: Secondary | ICD-10-CM | POA: Diagnosis not present

## 2018-10-09 DIAGNOSIS — J069 Acute upper respiratory infection, unspecified: Secondary | ICD-10-CM | POA: Diagnosis not present

## 2018-10-12 DIAGNOSIS — B009 Herpesviral infection, unspecified: Secondary | ICD-10-CM | POA: Diagnosis not present

## 2018-10-12 DIAGNOSIS — J309 Allergic rhinitis, unspecified: Secondary | ICD-10-CM | POA: Diagnosis not present

## 2018-10-12 DIAGNOSIS — J209 Acute bronchitis, unspecified: Secondary | ICD-10-CM | POA: Diagnosis not present

## 2018-10-12 DIAGNOSIS — J45909 Unspecified asthma, uncomplicated: Secondary | ICD-10-CM | POA: Diagnosis not present

## 2018-10-15 DIAGNOSIS — L0100 Impetigo, unspecified: Secondary | ICD-10-CM | POA: Diagnosis not present

## 2018-10-15 DIAGNOSIS — J069 Acute upper respiratory infection, unspecified: Secondary | ICD-10-CM | POA: Diagnosis not present

## 2018-10-15 DIAGNOSIS — J209 Acute bronchitis, unspecified: Secondary | ICD-10-CM | POA: Diagnosis not present

## 2018-10-16 DIAGNOSIS — Z20828 Contact with and (suspected) exposure to other viral communicable diseases: Secondary | ICD-10-CM | POA: Diagnosis not present

## 2018-11-23 DIAGNOSIS — Z01419 Encounter for gynecological examination (general) (routine) without abnormal findings: Secondary | ICD-10-CM | POA: Diagnosis not present

## 2018-11-23 DIAGNOSIS — Z6841 Body Mass Index (BMI) 40.0 and over, adult: Secondary | ICD-10-CM | POA: Diagnosis not present

## 2018-11-23 DIAGNOSIS — Z1231 Encounter for screening mammogram for malignant neoplasm of breast: Secondary | ICD-10-CM | POA: Diagnosis not present

## 2018-12-02 DIAGNOSIS — Z13820 Encounter for screening for osteoporosis: Secondary | ICD-10-CM | POA: Diagnosis not present

## 2021-03-14 ENCOUNTER — Other Ambulatory Visit: Payer: Self-pay | Admitting: Family Medicine

## 2021-03-14 ENCOUNTER — Other Ambulatory Visit: Payer: Self-pay

## 2021-03-14 ENCOUNTER — Ambulatory Visit
Admission: RE | Admit: 2021-03-14 | Discharge: 2021-03-14 | Disposition: A | Discharge status: Home / Self Care | Payer: 59 | Source: Ambulatory Visit | Attending: Family Medicine | Admitting: Family Medicine

## 2021-03-14 DIAGNOSIS — R0602 Shortness of breath: Secondary | ICD-10-CM

## 2021-08-02 ENCOUNTER — Telehealth: Payer: Self-pay

## 2021-08-02 NOTE — Telephone Encounter (Signed)
NOTES SCANNED TO REFERRAL 

## 2021-08-03 ENCOUNTER — Encounter: Payer: Self-pay | Admitting: Internal Medicine

## 2021-08-03 ENCOUNTER — Ambulatory Visit: Payer: 59 | Admitting: Internal Medicine

## 2021-08-03 ENCOUNTER — Other Ambulatory Visit: Payer: Self-pay

## 2021-08-03 ENCOUNTER — Ambulatory Visit (INDEPENDENT_AMBULATORY_CARE_PROVIDER_SITE_OTHER): Payer: 59

## 2021-08-03 VITALS — BP 144/100 | HR 72 | Ht 63.0 in | Wt 223.4 lb

## 2021-08-03 DIAGNOSIS — E785 Hyperlipidemia, unspecified: Secondary | ICD-10-CM | POA: Diagnosis not present

## 2021-08-03 DIAGNOSIS — R55 Syncope and collapse: Secondary | ICD-10-CM

## 2021-08-03 DIAGNOSIS — Z6841 Body Mass Index (BMI) 40.0 and over, adult: Secondary | ICD-10-CM | POA: Diagnosis not present

## 2021-08-03 DIAGNOSIS — I10 Essential (primary) hypertension: Secondary | ICD-10-CM

## 2021-08-03 NOTE — Progress Notes (Signed)
?Cardiology Office Note:   ? ?Date:  08/03/2021  ? ?ID:  Deanna Mcguire, DOB [birth year], MRN [id number] ? ?PCP:  Fanny Bien, MD  ? ?[facility] ?Cardiologist:  Lenna Sciara, MD ?Referring MD: Dian Queen, MD  ? ?Chief Complaint/Reason for Referral:  Syncope ? ?ASSESSMENT:   ? ?Syncope and collapse - Plan: EKG 12-Lead, LONG TERM MONITOR (3-14 DAYS), ECHOCARDIOGRAM COMPLETE ? ?BMI 40.0-44.9, adult ([place]) - Plan: EKG 12-Lead ? ?Hypertension, unspecified type - Plan: EKG 12-Lead ? ?Hyperlipidemia, unspecified hyperlipidemia type - Plan: EKG 12-Lead ? ?PLAN:   ? ?In order of problems listed above: ? ?Will obtain echocardiogram and monitor to evaluate further.  Will keep follow up open-ended depending on the results of this testing.  If these tests are unrevealing then I believe this is all due to an exuberant vasovagal reaction. ?I applauded her continuing efforts to lose weight.Marland Kitchen ?Her blood pressure is above goal today.  When she sees her next provider if it remains above goal her medication should be adjusted. ?Continue statin, this is being followed by PCP. ? ?     ? ?  ? ?Dispo:  No follow-ups on file.  ?  ? ?Medication Adjustments/Labs and Tests Ordered: ?Current medicines are reviewed at length with the patient today.  Concerns regarding medicines are outlined above.  ? ?Tests Ordered: ?Orders Placed This Encounter  ?Procedures  ? LONG TERM MONITOR (3-14 DAYS)  ? EKG 12-Lead  ? ECHOCARDIOGRAM COMPLETE  ? ? ?Medication Changes: ?No orders of the defined types were placed in this encounter. ? ? ?History of Present Illness:   ? ?FOCUSED PROBLEM LIST:   ?1.  Asthma ?2.  BMI of 40 ?3.  Hypertension ?4.  Hyperlipidemia ? ? ?The patient is a 61 y.o. female with the indicated medical history here for recommendations regarding syncope.  The patient was seen by her primary care provider recently.  She reported that she had an episode of seeing out around the time of her colonoscopy.  She has a history  of exuberant vasovagal response in response to needle draws and anesthesia.  She has had no symptoms since this time.  She denies any exertional angina.   She is actually lost a good deal of weight on her own accord.  She tells me she has lost about 30 pounds.  She denies any palpitations, paroxysmal nocturnal dyspnea, orthopnea.  She does have issues with wheezing given her asthma.  She is otherwise well without complaints. ? ?    ?  ?Previous Medical History: ?Past Medical History:  ?Diagnosis Date  ? Arthritis   ? Asthma   ? Depression   ? Hyperlipidemia   ? Hypertension   ? Infected sebaceous cyst, left buttocks. 04/22/2013  ? Thyroid disease   ? ? ? ?Current Medications: ?Current Meds  ?Medication Sig  ? ALPRAZolam (XANAX) 1 MG tablet Take 1 mg by mouth 2 (two) times daily as needed. anxiety  ? cetirizine (ZYRTEC) 10 MG tablet Take 10 mg by mouth daily.  ? diclofenac (VOLTAREN) 75 MG EC tablet Take 1 tablet (75 mg total) by mouth 2 (two) times daily.  ? escitalopram (LEXAPRO) 10 MG tablet Take 10 mg by mouth daily.  ? gabapentin (NEURONTIN) 800 MG tablet Take 800 mg by mouth 2 (two) times daily.  ? phentermine (ADIPEX-P) 37.5 MG tablet Take 37.5 mg by mouth every morning.  ? rosuvastatin (CRESTOR) 5 MG tablet Take 5 mg by mouth at bedtime.  ? tiotropium (SPIRIVA)  18 MCG inhalation capsule Place 18 mcg into inhaler and inhale daily.  ? valsartan (DIOVAN) 320 MG tablet Take 1 tablet by mouth daily.  ? vitamin B-12 (CYANOCOBALAMIN) 100 MCG tablet Take 1 tablet by mouth daily.  ?  ? ?Allergies:    ?Keflex [cephalexin], Penicillins, and Sulfa antibiotics  ? ?Social History:   ?Social History  ? ?Tobacco Use  ? Smoking status: Never  ? Smokeless tobacco: Never  ?Substance Use Topics  ? Alcohol use: No  ?  Alcohol/week: 0.0 standard drinks  ? Drug use: Yes  ?  Comment: Marijuana  ?  ? ?Family Hx: ?Family History  ?Problem Relation Age of Onset  ? Cancer Mother   ?     kidney & lung  ? Heart disease Father   ? Stroke  Father   ? COPD Sister   ? Hepatitis C Sister   ?  ? ?Review of Systems:   ?Please see the history of present illness.    ?All other systems reviewed and are negative. ?  ? ? ?EKGs/Labs/Other Test Reviewed:   ? ?EKG:  Sinus rhythm ? ?Prior CV studies: ?None available ? ?Imaging studies that I have independently reviewed today: CXR ? ?Recent Labs: ?No results found for requested labs within last 8760 hours.  ? ?Recent Lipid Panel ?No results found for: CHOL, TRIG, HDL, LDLCALC, LDLDIRECT ? ?Risk Assessment/Calculations:   ? ?  ?    ? ?Physical Exam:   ? ?VS:  BP (!) 144/100   Pulse 72   Ht 5\' 3"  (1.6 m)   Wt 223 lb 6 oz (101.3 kg)   SpO2 94%   BMI 39.57 kg/m?    ?Wt Readings from Last 3 Encounters:  ?08/03/21 223 lb 6 oz (101.3 kg)  ?11/22/14 241 lb 3.2 oz (109.4 kg)  ?04/22/13 214 lb (97.1 kg)  ?  ?GENERAL:  No apparent distress, AOx3,  ?HEENT:  No carotid bruits, +2 carotid impulses, no scleral icterus ?CAR: RRR  no murmurs, gallops, rubs, or thrills ?RES:  Clear to auscultation bilaterally ?ABD:  Soft, nontender, nondistended, positive bowel sounds x 4 ?VASC:  +2 radial pulses, +2 carotid pulses, palpable pedal pulses ?NEURO:  CN 2-12 grossly intact; motor and sensory grossly intact ?PSYCH:  No active depression or anxiety ?EXT:  No edema, ecchymosis, or cyanosis ? ?Signed, ?Early Osmond, MD  ?08/03/2021 8:58 AM    ?[facility] ?[address], [place], [place]  [address] ?Phone: [phone]; Fax: [phone]  ? ?Note:  This document was prepared using Dragon voice recognition software and may include unintentional dictation errors. ?

## 2021-08-03 NOTE — Patient Instructions (Signed)
Medication Instructions:  ?Your physician recommends that you continue on your current medications as directed. Please refer to the Current Medication list given to you today. ? ?*If you need a refill on your cardiac medications before your next appointment, please call your pharmacy* ? ? ?Lab Work: ?None ? ?If you have labs (blood work) drawn today and your tests are completely normal, you will receive your results only by: ?MyChart Message (if you have MyChart) OR ?A paper copy in the mail ?If you have any lab test that is abnormal or we need to change your treatment, we will call you to review the results. ? ? ?Testing/Procedures: ?Your physician has requested that you have an echocardiogram. Echocardiography is a painless test that uses sound waves to create images of your heart. It provides your doctor with information about the size and shape of your heart and how well your heart?s chambers and valves are working. This procedure takes approximately one hour. There are no restrictions for this procedure. ? ?Your physician has recommended that you wear a 3 day ZIO monitor. These monitors are medical devices that record the heart?s electrical activity. Doctors most often use these monitors to diagnose arrhythmias. Arrhythmias are problems with the speed or rhythm of the heartbeat. The monitor is a small, portable device. You can wear one while you do your normal daily activities. This is usually used to diagnose what is causing palpitations/syncope (passing out). ? ? ? ?Follow-Up: ?AS NEEDED ? ?Other Instructions ?ZIO XT- Long Term Monitor Instructions ? ?Your physician has requested you wear a ZIO patch monitor for 3 days.  ?This is a single patch monitor. [employer] supplies one patch monitor per enrollment. Additional ?stickers are not available. Please do not apply patch if you will be having a Nuclear Stress Test,  ?Echocardiogram, Cardiac CT, MRI, or Chest Xray during the period you would be wearing the   ?monitor. The patch cannot be worn during these tests. You cannot remove and re-apply the  ?ZIO XT patch monitor.  ?Your ZIO patch monitor will be mailed 3 day USPS to your address on file. It may take 3-5 days  ?to receive your monitor after you have been enrolled.  ?Once you have received your monitor, please review the enclosed instructions. Your monitor  ?has already been registered assigning a specific monitor serial # to you. ? ?Billing and Patient Assistance Program Information ? ?We have supplied [employer] with any of your insurance information on file for billing purposes. ?[employer] offers a sliding scale Patient Assistance Program for patients that do not have  ?insurance, or whose insurance does not completely cover the cost of the ZIO monitor.  ?You must apply for the Patient Assistance Program to qualify for this discounted rate.  ?To apply, please call [employer] at [phone], select option 4, select option 2, ask to apply for  ?Patient Assistance Program. Meredeth Ide will ask your household income, and how many people  ?are in your household. They will quote your out-of-pocket cost based on that information.  ?[employer] will also be able to set up a 31-month, interest-free payment plan if needed. ? ?Applying the monitor ?  ?Shave hair from upper left chest.  ?Hold abrader disc by orange tab. Rub abrader in 40 strokes over the upper left chest as  ?indicated in your monitor instructions.  ?Clean area with 4 enclosed alcohol pads. Let dry.  ?Apply patch as indicated in monitor instructions. Patch will be placed under collarbone on left  ?side of chest  with arrow pointing upward.  ?Rub patch adhesive wings for 2 minutes. Remove white label marked "1". Remove the white  ?label marked "2". Rub patch adhesive wings for 2 additional minutes.  ?While looking in a mirror, press and release button in center of patch. A small green light will  ?flash 3-4 times. This will be your only indicator that the monitor has been  turned on.  ?Do not shower for the first 24 hours. You may shower after the first 24 hours.  ?Press the button if you feel a symptom. You will hear a small click. Record Date, Time and  ?Symptom in the Patient Logbook.  ?When you are ready to remove the patch, follow instructions on the last 2 pages of Patient  ?Logbook. Stick patch monitor onto the last page of Patient Logbook.  ?Place Patient Logbook in the blue and white box. Use locking tab on box and tape box closed  ?securely. The blue and white box has prepaid postage on it. Please place it in the mailbox as  ?soon as possible. Your physician should have your test results approximately 7 days after the  ?monitor has been mailed back to [facility].  ?Call [facility] at [phone] if you have questions regarding  ?your ZIO XT patch monitor. Call them immediately if you see an orange light blinking on your  ?monitor.  ?If your monitor falls off in less than 4 days, contact our Monitor department at [phone].  ?If your monitor becomes loose or falls off after 4 days call [employer] at [phone] for  ?suggestions on securing your monitor ?  ?

## 2021-08-03 NOTE — Progress Notes (Unsigned)
Enrolled patient for a 3 day Zio XT monitor to be mailed to patients home  

## 2021-08-07 DIAGNOSIS — R55 Syncope and collapse: Secondary | ICD-10-CM | POA: Diagnosis not present

## 2021-08-17 ENCOUNTER — Ambulatory Visit (HOSPITAL_COMMUNITY): Payer: 59 | Attending: Cardiology

## 2021-08-17 ENCOUNTER — Other Ambulatory Visit: Payer: Self-pay

## 2021-08-17 DIAGNOSIS — R55 Syncope and collapse: Secondary | ICD-10-CM | POA: Insufficient documentation

## 2021-08-17 LAB — ECHOCARDIOGRAM COMPLETE
Area-P 1/2: 3.23 cm2
P 1/2 time: 363 msec
S' Lateral: 2.9 cm

## 2022-03-20 IMAGING — CR DG CHEST 2V
2 series · 2 of 2 positions shown · non-contrast
Comparison: Chest x-ray 01/31/2017.

CLINICAL DATA: 60-year-old female with history of shortness of
breath.

EXAM:
CHEST - 2 VIEW

[w chest pa]
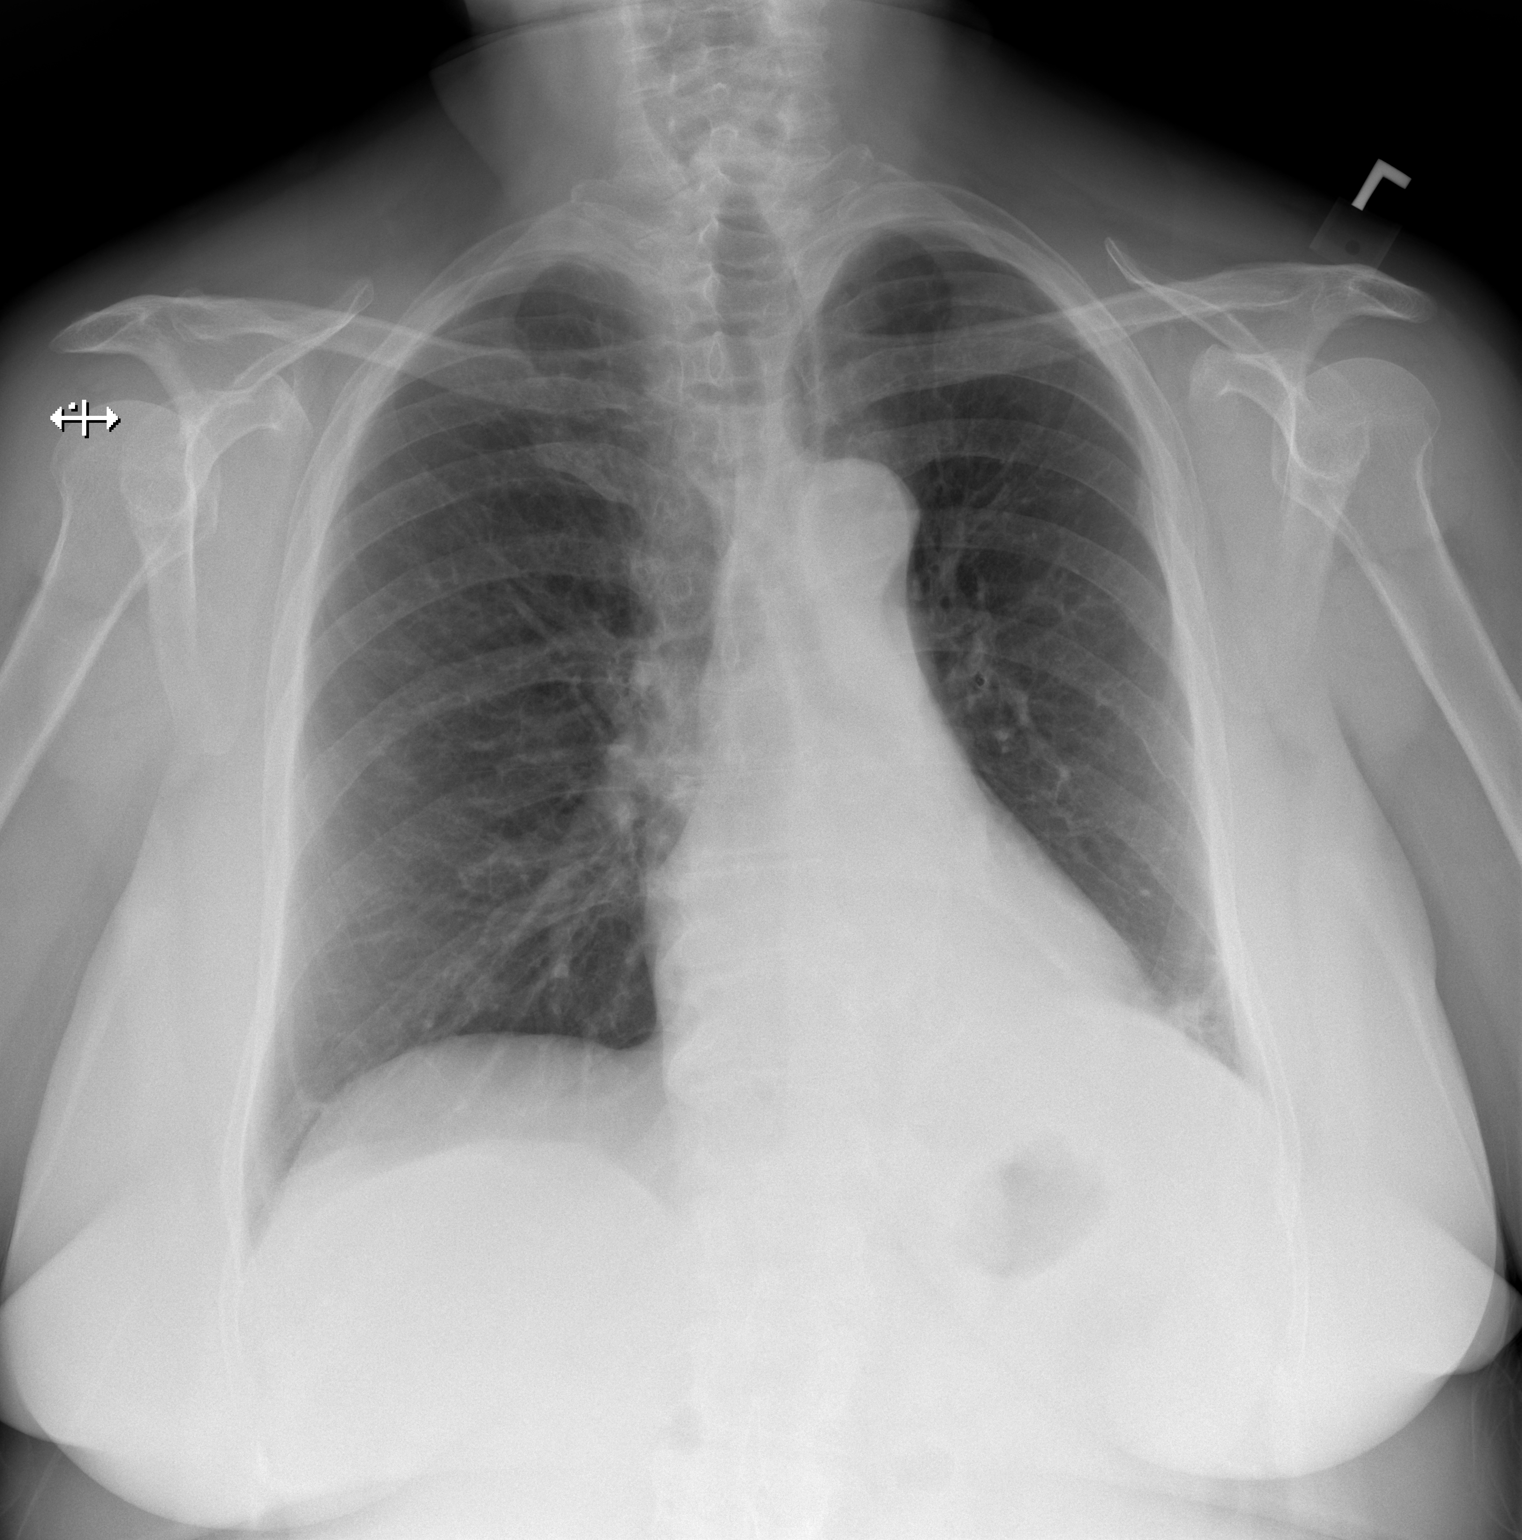

[w chest lat]
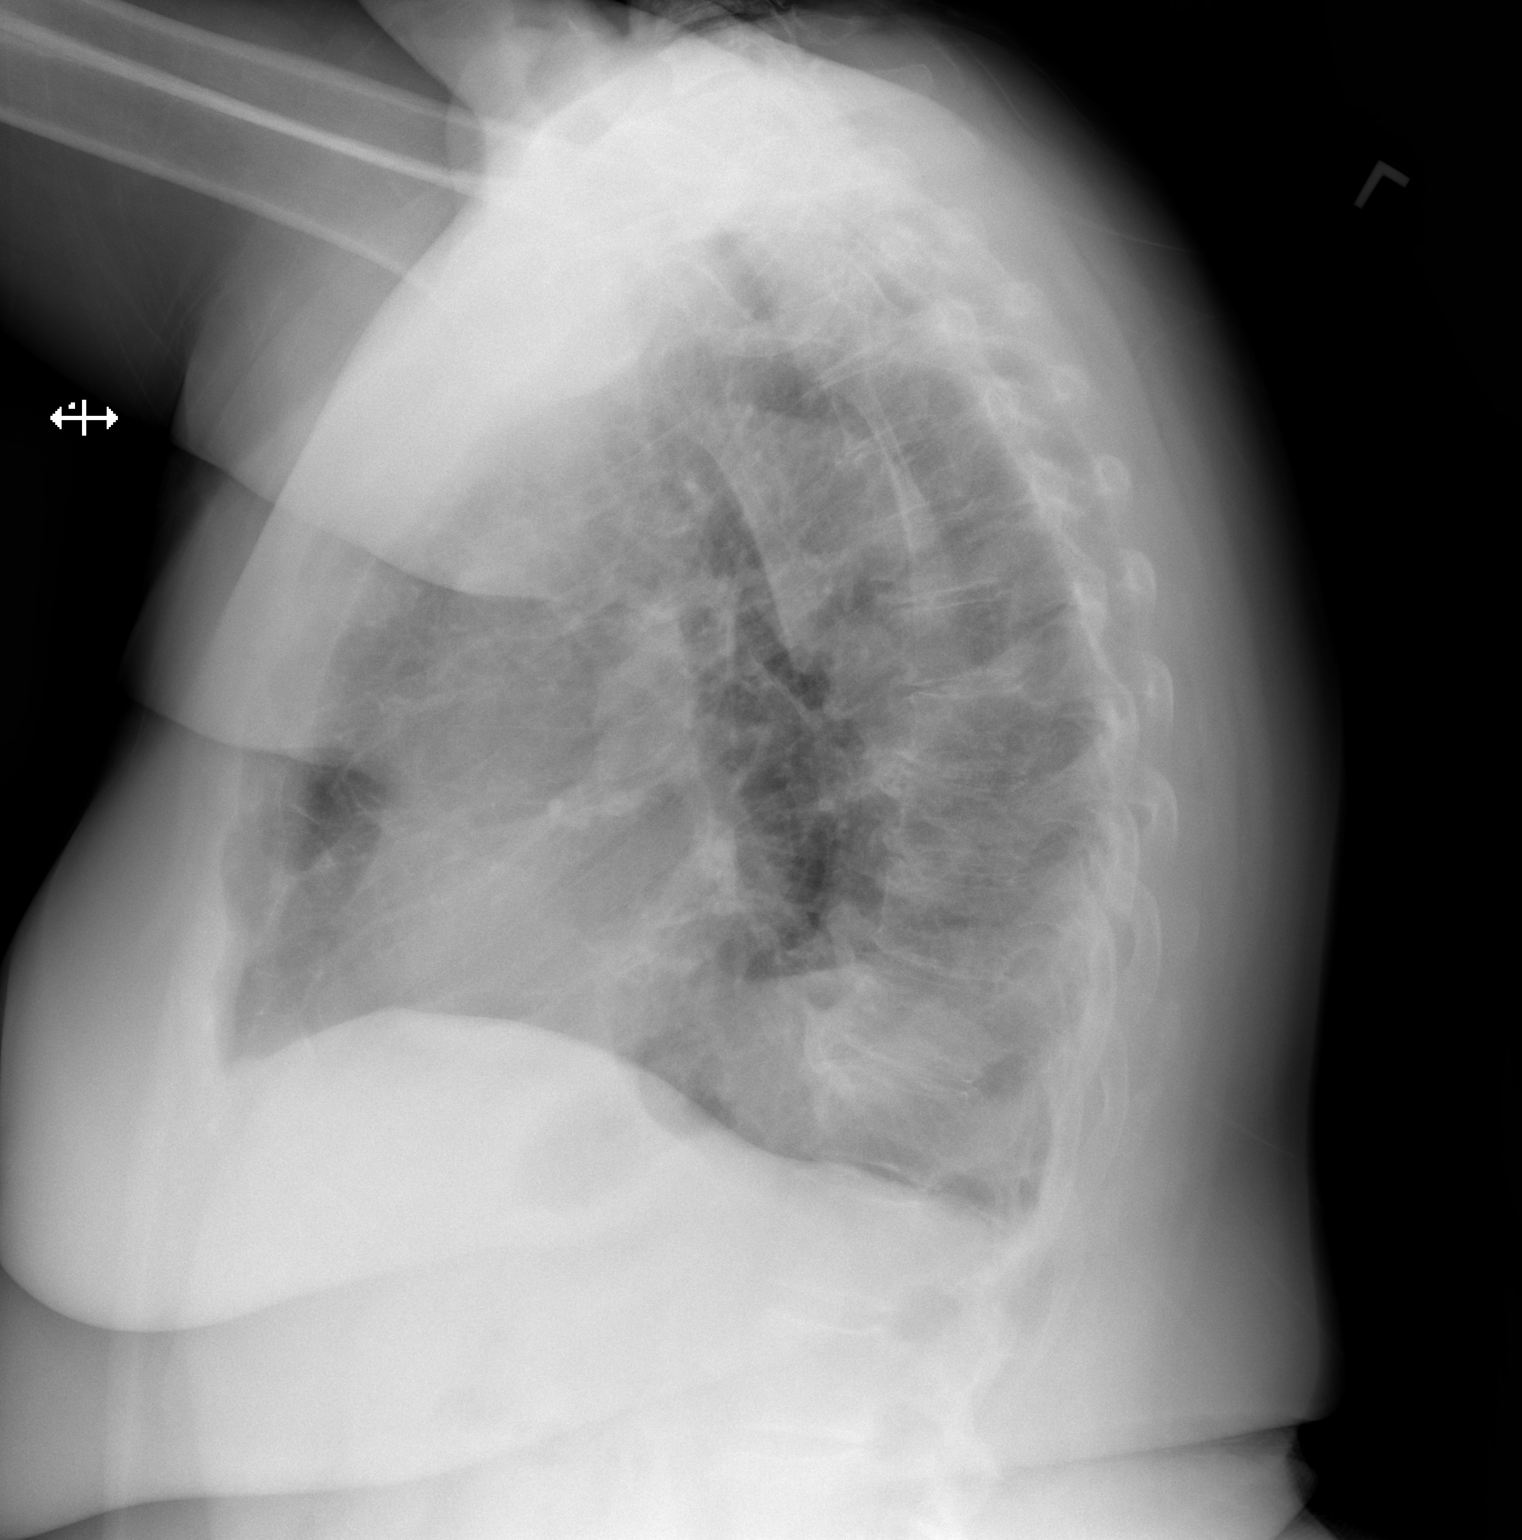

[2 of 2 positions shown; findings below may reference images not displayed]

FINDINGS: Complete obscuration of the left hemidiaphragm medially with
increased density projecting over the spine on lateral view
indicative of left lower lobe airspace consolidation. No pleural
effusions. No pneumothorax. No evidence of pulmonary edema. Heart
size is normal. Upper mediastinal contours are within normal limits.
Atherosclerotic calcifications in the thoracic aorta.
IMPRESSION: 1. Left lower lobe airspace consolidation concerning for pneumonia.
Followup PA and lateral chest X-ray is recommended in 3-4 weeks
following trial of antibiotic therapy to ensure resolution and
exclude underlying malignancy.

## 2023-01-21 ENCOUNTER — Other Ambulatory Visit: Payer: Self-pay | Admitting: Physician Assistant

## 2023-01-21 ENCOUNTER — Encounter: Payer: Self-pay | Admitting: Physician Assistant

## 2023-01-21 ENCOUNTER — Ambulatory Visit: Payer: 59 | Admitting: Physician Assistant

## 2023-01-21 VITALS — BP 181/89 | HR 78 | Temp 97.9°F | Ht 63.0 in | Wt 241.0 lb

## 2023-01-21 DIAGNOSIS — M79672 Pain in left foot: Secondary | ICD-10-CM

## 2023-01-21 DIAGNOSIS — I10 Essential (primary) hypertension: Secondary | ICD-10-CM

## 2023-01-21 DIAGNOSIS — Z13220 Encounter for screening for lipoid disorders: Secondary | ICD-10-CM

## 2023-01-21 DIAGNOSIS — E782 Mixed hyperlipidemia: Secondary | ICD-10-CM

## 2023-01-21 DIAGNOSIS — M79671 Pain in right foot: Secondary | ICD-10-CM | POA: Diagnosis not present

## 2023-01-21 DIAGNOSIS — J4520 Mild intermittent asthma, uncomplicated: Secondary | ICD-10-CM | POA: Diagnosis not present

## 2023-01-21 DIAGNOSIS — G8929 Other chronic pain: Secondary | ICD-10-CM

## 2023-01-21 DIAGNOSIS — Z1231 Encounter for screening mammogram for malignant neoplasm of breast: Secondary | ICD-10-CM

## 2023-01-21 DIAGNOSIS — Z6841 Body Mass Index (BMI) 40.0 and over, adult: Secondary | ICD-10-CM

## 2023-01-21 DIAGNOSIS — J45909 Unspecified asthma, uncomplicated: Secondary | ICD-10-CM | POA: Insufficient documentation

## 2023-01-21 MED ORDER — VENTOLIN HFA 108 (90 BASE) MCG/ACT IN AERS: 1.0000 | INHALATION_SPRAY | Freq: Four times a day (QID) | RESPIRATORY_TRACT | 0 refills | Status: DC | PRN

## 2023-01-21 MED ORDER — VALSARTAN 160 MG PO TABS: 160.0000 mg | ORAL_TABLET | Freq: Every day | ORAL | 1 refills | Status: DC

## 2023-01-21 NOTE — Patient Instructions (Addendum)
You are going to restart your blood pressure medication at a lower dose of 160 mg.  I do encourage you to check your blood pressure at home, keep a written log and have available for all office visits.  Please return to the mobile unit in 2 weeks for follow-up.  We will call you at the beginning of that week to let you know of our location.  I sent a refill of the albuterol inhaler as well.  I will restart the gabapentin once I have been able to review your lab results.  We will call you with those results.  I started a referral for a mammogram, they will reach out to you to schedule.  Roney Jaffe, PA-C Physician Assistant [facility] [email or web address]  How to Take Your Blood Pressure Blood pressure is a measurement of how strongly your blood is pressing against the walls of your arteries. Arteries are blood vessels that carry blood from your heart throughout your body. Your health care provider takes your blood pressure at each office visit. You can also take your own blood pressure at home with a blood pressure monitor. You may need to take your own blood pressure to: Confirm a diagnosis of high blood pressure (hypertension). Monitor your blood pressure over time. Make sure your blood pressure medicine is working. Supplies needed: Blood pressure monitor. A chair to sit in. This should be a chair where you can sit upright with your back supported. Do not sit on a soft couch or an armchair. Table or desk. Small notebook and pencil or pen. How to prepare To get the most accurate reading, avoid the following for 30 minutes before you check your blood pressure: Drinking caffeine. Drinking alcohol. Eating. Smoking. Exercising. Five minutes before you check your blood pressure: Use the bathroom and urinate so that you have an empty bladder. Sit quietly in a chair. Do not talk. How to take your blood pressure To check your blood  pressure, follow the instructions in the manual that came with your blood pressure monitor. If you have a digital blood pressure monitor, the instructions may be as follows: Sit up straight in a chair. Place your feet on the floor. Do not cross your ankles or legs. Rest your left arm at the level of your heart on a table or desk or on the arm of a chair. Pull up your shirt sleeve. Wrap the blood pressure cuff around the upper part of your left arm, 1 inch (2.5 cm) above your elbow. It is best to wrap the cuff around bare skin. Fit the cuff snugly, but not too tightly, around your arm. You should be able to place only one finger between the cuff and your arm. Position the cord so that it rests in the bend of your elbow. Press the power button. Sit quietly while the cuff inflates and deflates. Read the digital reading on the monitor screen and write the numbers down (record them) in a notebook. Wait 2-3 minutes, then repeat the steps, starting at step 1. What does my blood pressure reading mean? A blood pressure reading consists of a higher number over a lower number. Ideally, your blood pressure should be below 120/80. The first ("top") number is called the systolic pressure. It is a measure of the pressure in your arteries as your heart beats. The second ("bottom") number is called the diastolic pressure. It is a measure of the pressure in your arteries as the heart relaxes. Blood pressure is  classified into four stages. The following are the stages for adults who do not have a short-term serious illness or a chronic condition. Systolic pressure and diastolic pressure are measured in a unit called mm Hg (millimeters of mercury).  Normal Systolic pressure: below 120. Diastolic pressure: below 80. Elevated Systolic pressure: 120-129. Diastolic pressure: below 80. Hypertension stage 1 Systolic pressure: 130-139. Diastolic pressure: 80-89. Hypertension stage 2 Systolic pressure: 140 or  above. Diastolic pressure: 90 or above. You can have elevated blood pressure or hypertension even if only the systolic or only the diastolic number in your reading is higher than normal. Follow these instructions at home: Medicines Take over-the-counter and prescription medicines only as told by your health care provider. Tell your health care provider if you are having any side effects from blood pressure medicine. General instructions Check your blood pressure as often as recommended by your health care provider. Check your blood pressure at the same time every day. Take your monitor to the next appointment with your health care provider to make sure that: You are using it correctly. It provides accurate readings. Understand what your goal blood pressure numbers are. Keep all follow-up visits. This is important. General tips Your health care provider can suggest a reliable monitor that will meet your needs. There are several types of home blood pressure monitors. Choose a monitor that has an arm cuff. Do not choose a monitor that measures your blood pressure from your wrist or finger. Choose a cuff that wraps snugly, not too tight or too loose, around your upper arm. You should be able to fit only one finger between your arm and the cuff. You can buy a blood pressure monitor at most drugstores or online. Where to find more information American Heart Association: www.heart.org Contact a health care provider if: Your blood pressure is consistently high. Your blood pressure is suddenly low. Get help right away if: Your systolic blood pressure is higher than 180. Your diastolic blood pressure is higher than 120. These symptoms may be an emergency. Get help right away. Call 911. Do not wait to see if the symptoms will go away. Do not drive yourself to the hospital. Summary Blood pressure is a measurement of how strongly your blood is pressing against the walls of your arteries. A blood  pressure reading consists of a higher number over a lower number. Ideally, your blood pressure should be below 120/80. Check your blood pressure at the same time every day. Avoid caffeine, alcohol, smoking, and exercise for 30 minutes prior to checking your blood pressure. These agents can affect the accuracy of the blood pressure reading. This information is not intended to replace advice given to you by your health care provider. Make sure you discuss any questions you have with your health care provider. Document Revised: 02/01/2021 Document Reviewed: 02/01/2021 Elsevier Patient Education  2024 [employer].

## 2023-01-21 NOTE — Progress Notes (Unsigned)
New Patient Office Visit  Subjective    Patient ID: Deanna Mcguire, female    DOB: 29-Sep-1960  Age: 62 y.o. MRN: [id number]  CC:  Chief Complaint  Patient presents with   Medication Refill    HPI Deanna Mcguire states that she has been out of her medications for the past 2 to 3 months due to changes in her insurance.  States that she does have an upcoming appointment to establish care with a new primary care provider on October 2.  States that she was taking valsartan with good control of her blood pressure.  States that she has not been able to check her blood pressure at home.   States that she was previously diagnosed with asthma, states that she uses albuterol inhalers as a form of her rescue inhaler.  States that she had at one time been on a daily inhaler but feels that she has been well-controlled with using the albuterol inhaler as needed.  States that she has chronic bilateral foot pain and has previously been prescribed gabapentin by podiatry.  States that she has not had this medication in the past 2 years.  She was diagnosed with bilateral chronic plantar fasciitis.      Outpatient Encounter Medications as of 01/21/2023  Medication Sig   albuterol (VENTOLIN HFA) 108 (90 Base) MCG/ACT inhaler Inhale 1-2 puffs into the lungs every 6 (six) hours as needed for wheezing or shortness of breath.   gabapentin (NEURONTIN) 800 MG tablet Take 800 mg by mouth 2 (two) times daily.   tiotropium (SPIRIVA) 18 MCG inhalation capsule Place 18 mcg into inhaler and inhale daily.   [DISCONTINUED] valsartan (DIOVAN) 320 MG tablet Take 1 tablet by mouth daily.   ALPRAZolam (XANAX) 1 MG tablet Take 1 mg by mouth 2 (two) times daily as needed. anxiety (Patient not taking: Reported on 01/21/2023)   cetirizine (ZYRTEC) 10 MG tablet Take 10 mg by mouth daily.   cyclobenzaprine (FLEXERIL) 10 MG tablet Take 1 tablet (10 mg total) by mouth 2 (two) times daily as needed for muscle spasms. (Patient not  taking: Reported on 08/03/2021)   diclofenac (VOLTAREN) 75 MG EC tablet Take 1 tablet (75 mg total) by mouth 2 (two) times daily.   escitalopram (LEXAPRO) 10 MG tablet Take 10 mg by mouth daily. (Patient not taking: Reported on 01/21/2023)   phentermine (ADIPEX-P) 37.5 MG tablet Take 37.5 mg by mouth every morning. (Patient not taking: Reported on 01/21/2023)   rosuvastatin (CRESTOR) 5 MG tablet Take 5 mg by mouth at bedtime.   SYMBICORT 160-4.5 MCG/ACT inhaler Inhale 2 puffs into the lungs at bedtime. (Patient not taking: Reported on 08/03/2021)   valsartan (DIOVAN) 160 MG tablet Take 1 tablet (160 mg total) by mouth daily.   vitamin B-12 (CYANOCOBALAMIN) 100 MCG tablet Take 1 tablet by mouth daily. (Patient not taking: Reported on 01/21/2023)   No facility-administered encounter medications on file as of 01/21/2023.    Past Medical History:  Diagnosis Date   Arthritis    Asthma    Depression    Hyperlipidemia    Hypertension    Infected sebaceous cyst, left buttocks. 04/22/2013   Thyroid disease     Past Surgical History:  Procedure Laterality Date   NASAL SINUS SURGERY     3   PENILE ADHESIONS LYSIS     near liver and female organs    Family History  Problem Relation Age of Onset   Cancer Mother  kidney & lung   Heart disease Father    Stroke Father    COPD Sister    Hepatitis C Sister     Social History   Socioeconomic History   Marital status: Single    Spouse name: Not on file   Number of children: Not on file   Years of education: Not on file   Highest education level: Not on file  Occupational History   Not on file  Tobacco Use   Smoking status: Never   Smokeless tobacco: Never  Substance and Sexual Activity   Alcohol use: No    Alcohol/week: 0.0 standard drinks of alcohol   Drug use: Yes    Comment: Marijuana   Sexual activity: Not on file  Other Topics Concern   Not on file  Social History Narrative   Not on file   Social Determinants of Health    Financial Resource Strain: Not on file  Food Insecurity: Not on file  Transportation Needs: Not on file  Physical Activity: Not on file  Stress: Not on file  Social Connections: Not on file  Intimate Partner Violence: Not on file    Review of Systems  Constitutional: Negative.   HENT: Negative.    Eyes: Negative.   Respiratory:  Negative for shortness of breath.   Cardiovascular:  Negative for chest pain.  Gastrointestinal: Negative.   Genitourinary: Negative.   Musculoskeletal: Negative.   Skin: Negative.   Neurological: Negative.   Endo/Heme/Allergies: Negative.   Psychiatric/Behavioral: Negative.          Objective    BP (!) 181/89 (BP Location: Left Arm, Patient Position: Sitting, Cuff Size: Large)   Pulse 78   Temp 97.9 F (36.6 C) (Temporal)   Ht 5\' 3"  (1.6 m)   Wt 241 lb (109.3 kg)   SpO2 95%   BMI 42.69 kg/m   Physical Exam Vitals and nursing note reviewed.  Constitutional:      General: She is not in acute distress.    Appearance: Normal appearance. She is obese.  HENT:     Head: Normocephalic and atraumatic.     Right Ear: External ear normal.     Left Ear: External ear normal.     Nose: Nose normal.     Mouth/Throat:     Mouth: Mucous membranes are moist.     Pharynx: Oropharynx is clear.  Eyes:     Extraocular Movements: Extraocular movements intact.     Conjunctiva/sclera: Conjunctivae normal.     Pupils: Pupils are equal, round, and reactive to light.  Cardiovascular:     Rate and Rhythm: Normal rate and regular rhythm.     Pulses: Normal pulses.     Heart sounds: Normal heart sounds.  Pulmonary:     Effort: Pulmonary effort is normal.     Breath sounds: Normal breath sounds.  Musculoskeletal:        General: Normal range of motion.     Cervical back: Normal range of motion and neck supple.  Skin:    General: Skin is warm and dry.  Neurological:     General: No focal deficit present.     Mental Status: She is alert and oriented  to person, place, and time.  Psychiatric:        Mood and Affect: Mood normal.        Behavior: Behavior normal.        Thought Content: Thought content normal.        Judgment:  Judgment normal.         Assessment & Plan:   Problem List Items Addressed This Visit       Respiratory   Asthma   Relevant Medications   albuterol (VENTOLIN HFA) 108 (90 Base) MCG/ACT inhaler   Other Visit Diagnoses     Essential hypertension    -  Primary   Relevant Medications   valsartan (DIOVAN) 160 MG tablet   Other Relevant Orders   CBC with Differential/Platelet (Completed)   Comp. Metabolic Panel (12) (Completed)   Encounter for screening mammogram for malignant neoplasm of breast       Relevant Orders   MM 3D SCREENING MAMMOGRAM BILATERAL BREAST   Screening, lipid       Relevant Orders   Lipid panel (Completed)   Elevated blood pressure reading in office with diagnosis of hypertension       Relevant Medications   valsartan (DIOVAN) 160 MG tablet   Chronic pain in left foot       Chronic foot pain, right         1. Essential hypertension Restart valsartan.  Will start at lower dose.  Patient encouraged to check blood pressure on a daily basis, keep a written log and have available for all office visits.  Patient to return to mobile unit in 2 weeks for follow-up.  Red flags given for prompt reevaluation. - CBC with Differential/Platelet - Comp. Metabolic Panel (12) - valsartan (DIOVAN) 160 MG tablet; Take 1 tablet (160 mg total) by mouth daily.  Dispense: 30 tablet; Refill: 1  2. Elevated blood pressure reading in office with diagnosis of hypertension Patient declined clonidine in clinic.  Stated she was going to go pick up her medication and start it right away.  Red flags given for prompt reevaluation  3. Mild intermittent asthma without complication Restart albuterol. - albuterol (VENTOLIN HFA) 108 (90 Base) MCG/ACT inhaler; Inhale 1-2 puffs into the lungs every 6 (six) hours  as needed for wheezing or shortness of breath.  Dispense: 18 g; Refill: 0  4. Chronic pain in left foot Will restart gabapentin at lower dose based on lab results  5. Chronic foot pain, right   6. Encounter for screening mammogram for malignant neoplasm of breast  - MM 3D SCREENING MAMMOGRAM BILATERAL BREAST; Future  7. Screening, lipid Fasting labs completed today - Lipid panel   I have reviewed the patient's medical history (PMH, PSH, Social History, Family History, Medications, and allergies) , and have been updated if relevant. I spent 40 minutes reviewing chart and  face to face time with patient.     Return in about 2 weeks (around 02/04/2023) for With MMU.   Kasandra Knudsen Mayers, PA-C

## 2023-01-22 ENCOUNTER — Other Ambulatory Visit: Payer: Self-pay | Admitting: Physician Assistant

## 2023-01-22 DIAGNOSIS — E782 Mixed hyperlipidemia: Secondary | ICD-10-CM

## 2023-01-22 DIAGNOSIS — G8929 Other chronic pain: Secondary | ICD-10-CM | POA: Insufficient documentation

## 2023-01-22 DIAGNOSIS — I10 Essential (primary) hypertension: Secondary | ICD-10-CM | POA: Insufficient documentation

## 2023-01-22 LAB — LIPID PANEL
Chol/HDL Ratio: 3.7 ratio (ref 0.0–4.4)
Cholesterol, Total: 209 mg/dL — ABNORMAL HIGH (ref 100–199)
HDL: 56 mg/dL (ref >39)
LDL Chol Calc (NIH): 128 mg/dL — ABNORMAL HIGH (ref 0–99)
Triglycerides: 140 mg/dL (ref 0–149)
VLDL Cholesterol Cal: 25 mg/dL (ref 5–40)

## 2023-01-22 LAB — CBC WITH DIFFERENTIAL/PLATELET
Basophils Absolute: 0.1 10*3/uL (ref 0.0–0.2)
Basos: 1 %
EOS (ABSOLUTE): 0.3 10*3/uL (ref 0.0–0.4)
Eos: 4 %
Hematocrit: 38.8 % (ref 34.0–46.6)
Hemoglobin: 12.2 g/dL (ref 11.1–15.9)
Immature Grans (Abs): 0 10*3/uL (ref 0.0–0.1)
Immature Granulocytes: 1 %
Lymphocytes Absolute: 2 10*3/uL (ref 0.7–3.1)
Lymphs: 28 %
MCH: 29.3 pg (ref 26.6–33.0)
MCHC: 31.4 g/dL — ABNORMAL LOW (ref 31.5–35.7)
MCV: 93 fL (ref 79–97)
Monocytes Absolute: 0.5 10*3/uL (ref 0.1–0.9)
Monocytes: 7 %
Neutrophils Absolute: 4.3 10*3/uL (ref 1.4–7.0)
Neutrophils: 59 %
Platelets: 227 10*3/uL (ref 150–450)
RBC: 4.16 x10E6/uL (ref 3.77–5.28)
RDW: 12.6 % (ref 11.7–15.4)
WBC: 7.2 10*3/uL (ref 3.4–10.8)

## 2023-01-22 LAB — COMP. METABOLIC PANEL (12)
AST: 12 IU/L (ref 0–40)
Albumin: 4.3 g/dL (ref 3.9–4.9)
Alkaline Phosphatase: 73 IU/L (ref 44–121)
BUN/Creatinine Ratio: 24 (ref 12–28)
BUN: 15 mg/dL (ref 8–27)
Bilirubin Total: 0.2 mg/dL (ref 0.0–1.2)
Calcium: 9.7 mg/dL (ref 8.7–10.3)
Chloride: 104 mmol/L (ref 96–106)
Creatinine, Ser: 0.63 mg/dL (ref 0.57–1.00)
Globulin, Total: 2.6 g/dL (ref 1.5–4.5)
Glucose: 90 mg/dL (ref 70–99)
Potassium: 4.8 mmol/L (ref 3.5–5.2)
Sodium: 142 mmol/L (ref 134–144)
Total Protein: 6.9 g/dL (ref 6.0–8.5)
eGFR: 99 mL/min/{1.73_m2} (ref >59)

## 2023-01-22 MED ORDER — GABAPENTIN 100 MG PO CAPS: 100.0000 mg | ORAL_CAPSULE | Freq: Two times a day (BID) | ORAL | 1 refills | Status: DC

## 2023-01-22 MED ORDER — ROSUVASTATIN CALCIUM 5 MG PO TABS: 5.0000 mg | ORAL_TABLET | Freq: Every day | ORAL | 1 refills | Status: DC

## 2023-01-22 NOTE — Addendum Note (Signed)
Addended by: Roney Jaffe on: 01/22/2023 10:09 AM   Modules accepted: Orders

## 2023-02-04 ENCOUNTER — Other Ambulatory Visit: Payer: Self-pay | Admitting: Physician Assistant

## 2023-02-04 ENCOUNTER — Encounter: Payer: Self-pay | Admitting: Physician Assistant

## 2023-02-04 ENCOUNTER — Telehealth: Payer: Self-pay | Admitting: Family Medicine

## 2023-02-04 ENCOUNTER — Ambulatory Visit: Payer: Medicaid Other | Admitting: Physician Assistant

## 2023-02-04 VITALS — BP 150/76 | HR 76 | Ht 63.0 in | Wt 243.0 lb

## 2023-02-04 DIAGNOSIS — I10 Essential (primary) hypertension: Secondary | ICD-10-CM | POA: Diagnosis not present

## 2023-02-04 DIAGNOSIS — Z713 Dietary counseling and surveillance: Secondary | ICD-10-CM

## 2023-02-04 DIAGNOSIS — Z6841 Body Mass Index (BMI) 40.0 and over, adult: Secondary | ICD-10-CM | POA: Diagnosis not present

## 2023-02-04 MED ORDER — VALSARTAN 320 MG PO TABS: 320.0000 mg | ORAL_TABLET | Freq: Every day | ORAL | 1 refills | Status: DC

## 2023-02-04 MED ORDER — BLOOD PRESSURE CUFF MISC
1.0000 | Freq: Every day | Status: AC
Start: 2023-02-04 — End: ?

## 2023-02-04 NOTE — Progress Notes (Signed)
Established Patient Office Visit  Subjective   Patient ID: Deanna Mcguire, female    DOB: December 11, 1960  Age: 62 y.o. MRN: [id number]  Chief Complaint  Patient presents with   Follow-up    States that she has not been checking her blood pressure at home, states that she did take the valsartan twice a day for 1 week and over the last week is only taking it once a day.  States that she does feel much better.  States that she has been working on weight loss, states that she is walking between 7000 and 8000 steps a day.  States that she is drinking approximately 64 ounces of water a day.  States that she did restart rosuvastatin without concern.         Past Medical History:  Diagnosis Date   Arthritis    Asthma    Depression    Hyperlipidemia    Hypertension    Infected sebaceous cyst, left buttocks. 04/22/2013   Thyroid disease    Social History   Socioeconomic History   Marital status: Single    Spouse name: Not on file   Number of children: Not on file   Years of education: Not on file   Highest education level: Not on file  Occupational History   Not on file  Tobacco Use   Smoking status: Never   Smokeless tobacco: Never  Substance and Sexual Activity   Alcohol use: No    Alcohol/week: 0.0 standard drinks of alcohol   Drug use: Yes    Comment: Marijuana   Sexual activity: Not on file  Other Topics Concern   Not on file  Social History Narrative   Not on file   Social Determinants of Health   Financial Resource Strain: Not on file  Food Insecurity: Not on file  Transportation Needs: Not on file  Physical Activity: Not on file  Stress: Not on file  Social Connections: Not on file  Intimate Partner Violence: Not on file   Family History  Problem Relation Age of Onset   Cancer Mother        kidney & lung   Heart disease Father    Stroke Father    COPD Sister    Hepatitis C Sister    Allergies  Allergen Reactions   Keflex [Cephalexin] Anaphylaxis    Penicillins Anaphylaxis    Has patient had a PCN reaction causing immediate rash, facial/tongue/throat swelling, SOB or lightheadedness with hypotension: unknown Has patient had a PCN reaction causing severe rash involving mucus membranes or skin necrosis: unknown Has patient had a PCN reaction that required hospitalization: yes Did PCN reaction occurring within the last 10 years: no, childhood If all of the above answers are "NO", then may proceed with Cephalosporin use. Pt was told never to take penicillin again.    Sulfa Antibiotics     Childhood allergy    Review of Systems  Constitutional: Negative.   HENT: Negative.    Eyes: Negative.   Respiratory:  Negative for shortness of breath.   Cardiovascular:  Negative for chest pain.  Gastrointestinal: Negative.   Genitourinary: Negative.   Musculoskeletal: Negative.   Skin: Negative.   Neurological: Negative.   Endo/Heme/Allergies: Negative.   Psychiatric/Behavioral: Negative.        Objective:     BP (!) 150/76 (BP Location: Left Arm, Patient Position: Sitting, Cuff Size: Large)   Pulse 76   Ht 5\' 3"  (1.6 m)   Wt 243 lb (  110.2 kg)   SpO2 95%   BMI 43.05 kg/m  BP Readings from Last 3 Encounters:  02/04/23 (!) 150/76  01/21/23 (!) 181/89  08/03/21 (!) 144/100   Wt Readings from Last 3 Encounters:  02/04/23 243 lb (110.2 kg)  01/21/23 241 lb (109.3 kg)  08/03/21 223 lb 6 oz (101.3 kg)    Physical Exam Vitals and nursing note reviewed.  Constitutional:      Appearance: Normal appearance.  HENT:     Head: Normocephalic and atraumatic.     Right Ear: External ear normal.     Left Ear: External ear normal.     Nose: Nose normal.     Mouth/Throat:     Mouth: Mucous membranes are moist.     Pharynx: Oropharynx is clear.  Eyes:     Extraocular Movements: Extraocular movements intact.     Conjunctiva/sclera: Conjunctivae normal.     Pupils: Pupils are equal, round, and reactive to light.  Cardiovascular:      Rate and Rhythm: Normal rate and regular rhythm.     Pulses: Normal pulses.     Heart sounds: Normal heart sounds.  Pulmonary:     Effort: Pulmonary effort is normal.     Breath sounds: Normal breath sounds.  Musculoskeletal:        General: Normal range of motion.     Cervical back: Normal range of motion and neck supple.  Skin:    General: Skin is warm and dry.  Neurological:     General: No focal deficit present.     Mental Status: She is alert and oriented to person, place, and time.  Psychiatric:        Mood and Affect: Mood normal.        Behavior: Behavior normal.        Thought Content: Thought content normal.        Judgment: Judgment normal.        Assessment & Plan:   Problem List Items Addressed This Visit       Cardiovascular and Mediastinum   Essential hypertension   Relevant Medications   valsartan (DIOVAN) 320 MG tablet   Blood Pressure Monitoring (BLOOD PRESSURE CUFF) MISC     Other   Class 3 severe obesity due to excess calories with serious comorbidity and body mass index (BMI) of 40.0 to 44.9 in adult [facility])   Other Visit Diagnoses     Weight loss counseling, encounter for    -  Primary      1. Essential hypertension Increase valsartan 320 mg once daily.  Patient encouraged to check blood pressure at home, keep a written log and have available for all office visits.  Patient given prescription for blood pressure cuff, encouraged to follow-up with health insurance to find where she can get this filled.  Patient has upcoming appointment to establish care on October 2.  Patient encouraged to return to mobile unit as needed. - valsartan (DIOVAN) 320 MG tablet; Take 1 tablet (320 mg total) by mouth daily.  Dispense: 30 tablet; Refill: 1 - Blood Pressure Monitoring (BLOOD PRESSURE CUFF) MISC; 1 each by Does not apply route daily.  Dispense: 1 each; Refill: EACH  2. Weight loss counseling, encounter for 20 minutes spent with patient discussing mindful  eating.  3. Class 3 severe obesity due to excess calories with serious comorbidity and body mass index (BMI) of 40.0 to 44.9 in adult [facility])    I have reviewed the patient's medical history (  PMH, PSH, Social History, Family History, Medications, and allergies) , and have been updated if relevant. I spent 30 minutes reviewing chart and  face to face time with patient.    Return if symptoms worsen or fail to improve.    Kasandra Knudsen Mayers, PA-C

## 2023-02-04 NOTE — Telephone Encounter (Signed)
Contacted pt to inform of [facility]U locations for the week. Pt stated that she would come for her follow up today 9/3.

## 2023-02-04 NOTE — Patient Instructions (Addendum)
You are going to start taking the increased dose of valsartan.  I do encourage you to check your blood pressure at home on a daily basis, keep a written log and have available for all office visits.  Please call the number on the back of your Medicaid card to see where you are able to pick up the blood pressure cuff.  I encourage you to work on mindful eating, only eat if you are physically hungry and stop eating when you are physically satisfied.  Listen to your thoughts that you are experiencing when you desire to eat when you are not hungry.  Please feel free to return to the mobile unit at any time.  Please let us know if there is any else we can do for you.  Roney Jaffe, PA-C Physician Assistant [facility] [email or web address]  Health Maintenance, Female Adopting a healthy lifestyle and getting preventive care are important in promoting health and wellness. Ask your health care provider about: The right schedule for you to have regular tests and exams. Things you can do on your own to prevent diseases and keep yourself healthy. What should I know about diet, weight, and exercise? Eat a healthy diet  Eat a diet that includes plenty of vegetables, fruits, low-fat dairy products, and lean protein. Do not eat a lot of foods that are high in solid fats, added sugars, or sodium. Maintain a healthy weight Body mass index (BMI) is used to identify weight problems. It estimates body fat based on height and weight. Your health care provider can help determine your BMI and help you achieve or maintain a healthy weight. Get regular exercise Get regular exercise. This is one of the most important things you can do for your health. Most adults should: Exercise for at least 150 minutes each week. The exercise should increase your heart rate and make you sweat (moderate-intensity exercise). Do strengthening exercises at least twice a week.  This is in addition to the moderate-intensity exercise. Spend less time sitting. Even light physical activity can be beneficial. Watch cholesterol and blood lipids Have your blood tested for lipids and cholesterol at 64 years of age, then have this test every 5 years. Have your cholesterol levels checked more often if: Your lipid or cholesterol levels are high. You are older than 64 years of age. You are at high risk for heart disease. What should I know about cancer screening? Depending on your health history and family history, you may need to have cancer screening at various ages. This may include screening for: Breast cancer. Cervical cancer. Colorectal cancer. Skin cancer. Lung cancer. What should I know about heart disease, diabetes, and high blood pressure? Blood pressure and heart disease High blood pressure causes heart disease and increases the risk of stroke. This is more likely to develop in people who have high blood pressure readings or are overweight. Have your blood pressure checked: Every 3-5 years if you are 73-78 years of age. Every year if you are 40 years old or older. Diabetes Have regular diabetes screenings. This checks your fasting blood sugar level. Have the screening done: Once every three years after age 21 if you are at a normal weight and have a low risk for diabetes. More often and at a younger age if you are overweight or have a high risk for diabetes. What should I know about preventing infection? Hepatitis B If you have a higher risk for hepatitis B, you should be screened for  this virus. Talk with your health care provider to find out if you are at risk for hepatitis B infection. Hepatitis C Testing is recommended for: Everyone born from [birth year] through 1967. Anyone with known risk factors for hepatitis C. Sexually transmitted infections (STIs) Get screened for STIs, including gonorrhea and chlamydia, if: You are sexually active and are younger than  64 years of age. You are older than 64 years of age and your health care provider tells you that you are at risk for this type of infection. Your sexual activity has changed since you were last screened, and you are at increased risk for chlamydia or gonorrhea. Ask your health care provider if you are at risk. Ask your health care provider about whether you are at high risk for HIV. Your health care provider may recommend a prescription medicine to help prevent HIV infection. If you choose to take medicine to prevent HIV, you should first get tested for HIV. You should then be tested every 3 months for as long as you are taking the medicine. Pregnancy If you are about to stop having your period (premenopausal) and you may become pregnant, seek counseling before you get pregnant. Take 400 to 800 micrograms (mcg) of folic acid every day if you become pregnant. Ask for birth control (contraception) if you want to prevent pregnancy. Osteoporosis and menopause Osteoporosis is a disease in which the bones lose minerals and strength with aging. This can result in bone fractures. If you are 75 years old or older, or if you are at risk for osteoporosis and fractures, ask your health care provider if you should: Be screened for bone loss. Take a calcium or vitamin D supplement to lower your risk of fractures. Be given hormone replacement therapy (HRT) to treat symptoms of menopause. Follow these instructions at home: Alcohol use Do not drink alcohol if: Your health care provider tells you not to drink. You are pregnant, may be pregnant, or are planning to become pregnant. If you drink alcohol: Limit how much you have to: 0-1 drink a day. Know how much alcohol is in your drink. In the [place] one drink equals one 12 oz bottle of beer (355 mL), one 5 oz glass of wine (148 mL), or one 1 oz glass of hard liquor (44 mL). Lifestyle Do not use any products that contain nicotine or tobacco. These products  include cigarettes, chewing tobacco, and vaping devices, such as e-cigarettes. If you need help quitting, ask your health care provider. Do not use street drugs. Do not share needles. Ask your health care provider for help if you need support or information about quitting drugs. General instructions Schedule regular health, dental, and eye exams. Stay current with your vaccines. Tell your health care provider if: You often feel depressed. You have ever been abused or do not feel safe at home. Summary Adopting a healthy lifestyle and getting preventive care are important in promoting health and wellness. Follow your health care provider's instructions about healthy diet, exercising, and getting tested or screened for diseases. Follow your health care provider's instructions on monitoring your cholesterol and blood pressure. This information is not intended to replace advice given to you by your health care provider. Make sure you discuss any questions you have with your health care provider. Document Revised: 10/09/2020 Document Reviewed: 10/09/2020 Elsevier Patient Education  2024 [employer].

## 2023-02-21 ENCOUNTER — Ambulatory Visit
Admission: RE | Admit: 2023-02-21 | Discharge: 2023-02-21 | Disposition: A | Discharge status: Home / Self Care | Payer: Medicaid Other | Source: Ambulatory Visit | Attending: Physician Assistant | Admitting: Physician Assistant

## 2023-02-21 DIAGNOSIS — Z1231 Encounter for screening mammogram for malignant neoplasm of breast: Secondary | ICD-10-CM

## 2023-02-25 NOTE — Progress Notes (Signed)
Let pt know mammogram is normal recheck one year

## 2023-02-26 ENCOUNTER — Telehealth: Payer: Self-pay

## 2023-02-26 NOTE — Telephone Encounter (Signed)
-----   Message from Shan Levans sent at 02/25/2023  4:33 AM EDT ----- Let pt know mammogram is normal recheck one year

## 2023-02-26 NOTE — Telephone Encounter (Signed)
Pt was called twice and no vm was left due to mailbox not being set up.Information was sent to nurse pool.

## 2023-03-05 ENCOUNTER — Ambulatory Visit: Payer: 59 | Admitting: Family Medicine

## 2023-03-10 ENCOUNTER — Telehealth: Payer: Self-pay | Admitting: Physician Assistant

## 2023-03-10 NOTE — Telephone Encounter (Signed)
Contacted pt to inform her of MMU locations for the week. Thee pt stated she would come see [place] Tuesday at the [facility] after 2 pm.   Pt also stated she is in need of a refill of her Gabapentin.

## 2023-03-11 ENCOUNTER — Ambulatory Visit: Payer: Medicaid Other | Admitting: Physician Assistant

## 2023-03-11 ENCOUNTER — Encounter: Payer: Self-pay | Admitting: Physician Assistant

## 2023-03-11 VITALS — BP 143/83 | HR 74 | Ht 63.0 in | Wt 247.0 lb

## 2023-03-11 DIAGNOSIS — M79672 Pain in left foot: Secondary | ICD-10-CM | POA: Diagnosis not present

## 2023-03-11 DIAGNOSIS — M79671 Pain in right foot: Secondary | ICD-10-CM | POA: Diagnosis not present

## 2023-03-11 DIAGNOSIS — I10 Essential (primary) hypertension: Secondary | ICD-10-CM | POA: Diagnosis not present

## 2023-03-11 DIAGNOSIS — M150 Primary generalized (osteo)arthritis: Secondary | ICD-10-CM

## 2023-03-11 DIAGNOSIS — J302 Other seasonal allergic rhinitis: Secondary | ICD-10-CM

## 2023-03-11 DIAGNOSIS — R52 Pain, unspecified: Secondary | ICD-10-CM

## 2023-03-11 DIAGNOSIS — F411 Generalized anxiety disorder: Secondary | ICD-10-CM

## 2023-03-11 DIAGNOSIS — E66813 Obesity, class 3: Secondary | ICD-10-CM

## 2023-03-11 DIAGNOSIS — Z2821 Immunization not carried out because of patient refusal: Secondary | ICD-10-CM

## 2023-03-11 DIAGNOSIS — Z6841 Body Mass Index (BMI) 40.0 and over, adult: Secondary | ICD-10-CM

## 2023-03-11 DIAGNOSIS — E782 Mixed hyperlipidemia: Secondary | ICD-10-CM

## 2023-03-11 DIAGNOSIS — G8929 Other chronic pain: Secondary | ICD-10-CM

## 2023-03-11 MED ORDER — MELOXICAM 7.5 MG PO TABS
7.5000 mg | ORAL_TABLET | Freq: Every day | ORAL | 2 refills | Status: DC
Start: 2023-03-11 — End: 2023-05-20

## 2023-03-11 MED ORDER — ROSUVASTATIN CALCIUM 5 MG PO TABS
5.0000 mg | ORAL_TABLET | Freq: Every day | ORAL | 1 refills | Status: DC
Start: 2023-03-11 — End: 2023-05-20

## 2023-03-11 MED ORDER — VALSARTAN 320 MG PO TABS: 320.0000 mg | ORAL_TABLET | Freq: Every day | ORAL | 1 refills | Status: DC

## 2023-03-11 MED ORDER — GABAPENTIN 100 MG PO CAPS
100.0000 mg | ORAL_CAPSULE | Freq: Two times a day (BID) | ORAL | 1 refills | Status: DC
Start: 2023-03-11 — End: 2023-05-20

## 2023-03-11 MED ORDER — FLUTICASONE PROPIONATE 50 MCG/ACT NA SUSP
2.0000 | Freq: Every day | NASAL | 6 refills | Status: AC
Start: 2023-03-11 — End: ?

## 2023-03-11 MED ORDER — HYDROXYZINE HCL 10 MG PO TABS
10.0000 mg | ORAL_TABLET | Freq: Three times a day (TID) | ORAL | 2 refills | Status: DC | PRN
Start: 2023-03-11 — End: 2023-08-13

## 2023-03-11 NOTE — Patient Instructions (Signed)
Your blood pressure is slightly elevated today.  I do encourage you to check your blood pressure at home, keep a written log and have available for our office visits.  I encourage you to work on discontinuing use of Goody's powders.  Please start taking Mobic once daily in the morning.  If after you discontinue the Goody's powders you do not feel the Mobic is strong enough, please follow-up with the mobile unit for further instructions.  To help with your anxiety, you can use hydroxyzine 10 mg to 20 mg every 6-8 hours as needed.  To help with your ear fullness, I encourage you to start using Flonase on a daily basis.  You will return to the mobile unit in 3 months, we will call you at that time to let you know of our location.  Roney Jaffe, PA-C Physician Assistant [facility] [email or web address]  Managing Anxiety, Adult After being diagnosed with anxiety, you may be relieved to know why you have felt or behaved a certain way. You may also feel overwhelmed about the treatment ahead and what it will mean for your life. With care and support, you can manage your anxiety. How to manage lifestyle changes Understanding the difference between stress and anxiety Although stress can play a role in anxiety, it is not the same as anxiety. Stress is your body's reaction to life changes and events, both good and bad. Stress is often caused by something external, such as a deadline, test, or competition. It normally goes away after the event has ended and will last just a few hours. But, stress can be ongoing and can lead to more than just stress. Anxiety is caused by something internal, such as imagining a terrible outcome or worrying that something will go wrong that will greatly upset you. Anxiety often does not go away even after the event is over, and it can become a long-term (chronic) worry. Lowering stress and anxiety Talk with your health  care provider or a counselor to learn more about lowering anxiety and stress. They may suggest tension-reduction techniques, such as: Music. Spend time creating or listening to music that you enjoy and that inspires you. Mindfulness-based meditation. Practice being aware of your normal breaths while not trying to control your breathing. It can be done while sitting or walking. Centering prayer. Focus on a word, phrase, or sacred image that means something to you and brings you peace. Deep breathing. Expand your stomach and inhale slowly through your nose. Hold your breath for 3-5 seconds. Then breathe out slowly, letting your stomach muscles relax. Self-talk. Learn to notice and spot thought patterns that lead to anxiety reactions. Change those patterns to thoughts that feel peaceful. Muscle relaxation. Take time to tense muscles and then relax them. Choose a tension-reduction technique that fits your lifestyle and personality. These techniques take time and practice. Set aside 5-15 minutes a day to do them. Specialized therapists can offer counseling and training in these techniques. The training to help with anxiety may be covered by some insurance plans. Other things you can do to manage stress and anxiety include: Keeping a stress diary. This can help you learn what triggers your reaction and then learn ways to manage your response. Thinking about how you react to certain situations. You may not be able to control everything, but you can control your response. Making time for activities that help you relax and not feeling guilty about spending your time in this way. Doing visual imagery.  This involves imagining or creating mental pictures to help you relax. Practicing yoga. Through yoga poses, you can lower tension and relax.  Medicines Medicines for anxiety include: Antidepressant medicines. These are usually prescribed for long-term daily control. Anti-anxiety medicines. These may be added in  severe cases, especially when panic attacks occur. When used together, medicines, psychotherapy, and tension-reduction techniques may be the most effective treatment. Relationships Relationships can play a big part in helping you recover. Spend more time connecting with trusted friends and family members. Think about going to couples counseling if you have a partner, taking family education classes, or going to family therapy. Therapy can help you and others better understand your anxiety. How to recognize changes in your anxiety Everyone responds differently to treatment for anxiety. Recovery from anxiety happens when symptoms lessen and stop interfering with your daily life at home or work. This may mean that you will start to: Have better concentration and focus. Worry will interfere less in your daily thinking. Sleep better. Be less irritable. Have more energy. Have improved memory. Try to recognize when your condition is getting worse. Contact your provider if your symptoms interfere with home or work and you feel like your condition is not improving. Follow these instructions at home: Activity Exercise. Adults should: Exercise for at least 150 minutes each week. The exercise should increase your heart rate and make you sweat (moderate-intensity exercise). Do strengthening exercises at least twice a week. Get the right amount and quality of sleep. Most adults need 7-9 hours of sleep each night. Lifestyle  Eat a healthy diet that includes plenty of vegetables, fruits, whole grains, low-fat dairy products, and lean protein. Do not eat a lot of foods that are high in fats, added sugars, or salt (sodium). Make choices that simplify your life. Do not use any products that contain nicotine or tobacco. These products include cigarettes, chewing tobacco, and vaping devices, such as e-cigarettes. If you need help quitting, ask your provider. Avoid caffeine, alcohol, and certain over-the-counter  cold medicines. These may make you feel worse. Ask your pharmacist which medicines to avoid. General instructions Take over-the-counter and prescription medicines only as told by your provider. Keep all follow-up visits. This is to make sure you are managing your anxiety well or if you need more support. Where to find support You can get help and support from: Self-help groups. Online and [employer]. A trusted spiritual leader. Couples counseling. Family education classes. Family therapy. Where to find more information You may find that joining a support group helps you deal with your anxiety. The following sources can help you find counselors or support groups near you: Mental Health America: mentalhealthamerica.net Anxiety and Depression Association of [place] (ADAA): adaa.org The [employer] on Mental Illness (NAMI): nami.org Contact a health care provider if: You have a hard time staying focused or finishing tasks. You spend many hours a day feeling worried about everyday life. You are very tired because you cannot stop worrying. You start to have headaches or often feel tense. You have chronic nausea or diarrhea. Get help right away if: Your heart feels like it is racing. You have shortness of breath. You have thoughts of hurting yourself or others. Get help right away if you feel like you may hurt yourself or others, or have thoughts about taking your own life. Go to your nearest emergency room or: Call 911. Call the National Suicide Prevention Lifeline at (306) 840-8301 or 988. This is open 24 hours a day. Text the  Crisis Text Line at 519-086-6631. This information is not intended to replace advice given to you by your health care provider. Make sure you discuss any questions you have with your health care provider. Document Revised: 02/26/2022 Document Reviewed: 09/10/2020 Elsevier Patient Education  2024 [employer].

## 2023-03-11 NOTE — Progress Notes (Unsigned)
Established Patient Office Visit  Subjective   Patient ID: Deanna Mcguire, female    DOB: Sep 17, 1960  Age: 62 y.o. MRN: [id number]  Chief Complaint  Patient presents with   Follow-up   Anxiety   Medication Refill   Ear Fullness    States that she has been compliant to her blood pressure medications, states that she has not been checking her blood pressure at home.  States that she "feels a lot better at this time"  Does endorse that she has been using 1-2 Goody's powders on an every day basis "for a long time" for generalized bodyaches, and chronic bilateral foot pain.  States that she did take 1 prior to today's office visit.  States that she has been having elevated anxiety.  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  States that she used to take Xanax with relief of anxiety.  States that she does not want to be on a daily medication, states that she wants to have something available to use if her anxiety becomes elevated.  States that sleep is good.   States today that she has been experiencing a sensation of fullness in her ears.  Has not tried anything for relief.     Past Medical History:  Diagnosis Date   Arthritis    Asthma    Depression    Hyperlipidemia    Hypertension    Infected sebaceous cyst, left buttocks. 04/22/2013   Thyroid disease    Social History   Socioeconomic History   Marital status: Single    Spouse name: Not on file   Number of children: Not on file   Years of education: Not on file   Highest education level: Not on file  Occupational History   Not on file  Tobacco Use   Smoking status: Never   Smokeless tobacco: Never  Substance and Sexual Activity   Alcohol use: No    Alcohol/week: 0.0 standard drinks of alcohol   Drug use: Yes    Comment: Marijuana   Sexual activity: Not on file  Other Topics Concern   Not on file  Social History  Narrative   Not on file   Social Determinants of Health   Financial Resource Strain: Not on file  Food Insecurity: Not on file  Transportation Needs: Not on file  Physical Activity: Not on file  Stress: Not on file  Social Connections: Not on file  Intimate Partner Violence: Not on file   Family History  Problem Relation Age of Onset   Cancer Mother        kidney & lung   Heart disease Father    Stroke Father    COPD Sister    Hepatitis C Sister    Allergies  Allergen Reactions   Keflex [Cephalexin] Anaphylaxis   Penicillins Anaphylaxis    Has patient had a PCN reaction causing immediate rash, facial/tongue/throat swelling, SOB or lightheadedness with hypotension: unknown Has patient had a PCN reaction causing severe rash involving mucus membranes or skin necrosis: unknown Has patient had a PCN reaction that required hospitalization: yes Did PCN reaction occurring within the last 10 years: no, childhood If all of the above answers are "NO", then may proceed with Cephalosporin use. Pt was told never to take penicillin again.    Sulfa Antibiotics     Childhood allergy    Review of  Systems  Constitutional:  Negative for chills and fever.  HENT:  Negative for congestion, ear pain, sinus pain and sore throat.   Eyes: Negative.   Respiratory:  Negative for cough and shortness of breath.   Cardiovascular:  Negative for chest pain.  Gastrointestinal:  Negative for heartburn.  Genitourinary: Negative.   Musculoskeletal:  Positive for joint pain and myalgias.  Skin: Negative.   Neurological: Negative.   Endo/Heme/Allergies: Negative.   Psychiatric/Behavioral:  Negative for depression. The patient is nervous/anxious. The patient does not have insomnia.       Objective:     BP (!) 143/83 (BP Location: Left Arm, Patient Position: Sitting, Cuff Size: Large)   Pulse 74   Ht 5\' 3"  (1.6 m)   Wt 247 lb (112 kg)   SpO2 93%   BMI 43.75 kg/m  BP Readings from Last 3  Encounters:  03/11/23 (!) 143/83  02/04/23 (!) 150/76  01/21/23 (!) 181/89   Wt Readings from Last 3 Encounters:  03/11/23 247 lb (112 kg)  02/04/23 243 lb (110.2 kg)  01/21/23 241 lb (109.3 kg)    Physical Exam Vitals and nursing note reviewed.  Constitutional:      Appearance: Normal appearance. She is obese.  HENT:     Head: Normocephalic and atraumatic.     Right Ear: External ear normal.     Left Ear: External ear normal.     Nose: Nose normal.     Mouth/Throat:     Mouth: Mucous membranes are moist.     Pharynx: Oropharynx is clear.  Eyes:     Extraocular Movements: Extraocular movements intact.     Conjunctiva/sclera: Conjunctivae normal.     Pupils: Pupils are equal, round, and reactive to light.  Cardiovascular:     Rate and Rhythm: Normal rate and regular rhythm.     Pulses: Normal pulses.     Heart sounds: Normal heart sounds.  Pulmonary:     Effort: Pulmonary effort is normal.     Breath sounds: Normal breath sounds.  Musculoskeletal:        General: Normal range of motion.     Cervical back: Normal range of motion.  Skin:    General: Skin is warm and dry.  Neurological:     General: No focal deficit present.     Mental Status: She is alert and oriented to person, place, and time.  Psychiatric:        Mood and Affect: Mood normal.        Behavior: Behavior normal.        Thought Content: Thought content normal.        Judgment: Judgment normal.        Assessment & Plan:   Problem List Items Addressed This Visit       Cardiovascular and Mediastinum   Essential hypertension - Primary   Relevant Medications   rosuvastatin (CRESTOR) 5 MG tablet   valsartan (DIOVAN) 320 MG tablet     Musculoskeletal and Integument   Primary osteoarthritis involving multiple joints   Relevant Medications   meloxicam (MOBIC) 7.5 MG tablet     Other   Chronic pain in left foot   Relevant Medications   meloxicam (MOBIC) 7.5 MG tablet   gabapentin (NEURONTIN)  100 MG capsule   Chronic foot pain, right   Relevant Medications   meloxicam (MOBIC) 7.5 MG tablet   gabapentin (NEURONTIN) 100 MG capsule   Class 3 severe obesity due to excess calories with  serious comorbidity and body mass index (BMI) of 40.0 to 44.9 in adult [facility])   Anxiety state   Relevant Medications   hydrOXYzine (ATARAX) 10 MG tablet   Seasonal allergies   Relevant Medications   fluticasone (FLONASE) 50 MCG/ACT nasal spray   Mixed hyperlipidemia   Relevant Medications   rosuvastatin (CRESTOR) 5 MG tablet   valsartan (DIOVAN) 320 MG tablet   Other Visit Diagnoses     Influenza vaccination declined       COVID-19 vaccination declined          1. Essential hypertension Current regimen.  Patient encouraged to check blood pressure at home, keep a written log and have available for all office visits.  Patient to follow-up with mobile unit in 3 months. - valsartan (DIOVAN) 320 MG tablet; Take 1 tablet (320 mg total) by mouth daily.  Dispense: 90 tablet; Refill: 1  2. Primary osteoarthritis involving multiple joints Trial Mobic.  Patient encouraged to discontinue Goody's powders due to significant potential adverse effects.  Patient education given on lifestyle modifications - meloxicam (MOBIC) 7.5 MG tablet; Take 1 tablet (7.5 mg total) by mouth daily.  Dispense: 30 tablet; Refill: 2  3. Chronic pain in left foot Continue current regimen - gabapentin (NEURONTIN) 100 MG capsule; Take 1 capsule (100 mg total) by mouth 2 (two) times daily.  Dispense: 180 capsule; Refill: 1  4. Chronic foot pain, right  - gabapentin (NEURONTIN) 100 MG capsule; Take 1 capsule (100 mg total) by mouth 2 (two) times daily.  Dispense: 180 capsule; Refill: 1  5. Anxiety state Trial hydroxyzine.  Patient education given on supportive care - hydrOXYzine (ATARAX) 10 MG tablet; Take 1 tablet (10 mg total) by mouth 3 (three) times daily as needed.  Dispense: 30 tablet; Refill: 2  6. Mixed  hyperlipidemia Continue current regimen - rosuvastatin (CRESTOR) 5 MG tablet; Take 1 tablet (5 mg total) by mouth at bedtime.  Dispense: 90 tablet; Refill: 1  7. Seasonal allergies / Ear Fullness Trial Flonase.  Patient education given on supportive care - fluticasone (FLONASE) 50 MCG/ACT nasal spray; Place 2 sprays into both nostrils daily.  Dispense: 16 g; Refill: 6  8. Influenza vaccination declined   9. COVID-19 vaccination declined    I have reviewed the patient's medical history (PMH, PSH, Social History, Family History, Medications, and allergies) , and have been updated if relevant. I spent 30 minutes reviewing chart and  face to face time with patient.    Return in about 3 months (around 06/11/2023) for With [facility].    Kasandra Knudsen Mayers, PA-C

## 2023-03-13 ENCOUNTER — Encounter: Payer: Self-pay | Admitting: Physician Assistant

## 2023-03-13 DIAGNOSIS — M150 Primary generalized (osteo)arthritis: Secondary | ICD-10-CM | POA: Insufficient documentation

## 2023-03-13 DIAGNOSIS — E782 Mixed hyperlipidemia: Secondary | ICD-10-CM | POA: Insufficient documentation

## 2023-03-13 DIAGNOSIS — F411 Generalized anxiety disorder: Secondary | ICD-10-CM | POA: Insufficient documentation

## 2023-03-13 DIAGNOSIS — J302 Other seasonal allergic rhinitis: Secondary | ICD-10-CM | POA: Insufficient documentation

## 2023-05-14 ENCOUNTER — Other Ambulatory Visit: Payer: Self-pay | Admitting: Physician Assistant

## 2023-05-14 DIAGNOSIS — J4520 Mild intermittent asthma, uncomplicated: Secondary | ICD-10-CM

## 2023-05-20 ENCOUNTER — Encounter: Payer: Self-pay | Admitting: Physician Assistant

## 2023-05-20 ENCOUNTER — Ambulatory Visit: Payer: Medicaid Other | Admitting: Physician Assistant

## 2023-05-20 DIAGNOSIS — M79672 Pain in left foot: Secondary | ICD-10-CM | POA: Diagnosis not present

## 2023-05-20 DIAGNOSIS — M150 Primary generalized (osteo)arthritis: Secondary | ICD-10-CM | POA: Diagnosis not present

## 2023-05-20 DIAGNOSIS — E782 Mixed hyperlipidemia: Secondary | ICD-10-CM

## 2023-05-20 DIAGNOSIS — M79671 Pain in right foot: Secondary | ICD-10-CM

## 2023-05-20 DIAGNOSIS — I10 Essential (primary) hypertension: Secondary | ICD-10-CM | POA: Diagnosis not present

## 2023-05-20 DIAGNOSIS — G8929 Other chronic pain: Secondary | ICD-10-CM

## 2023-05-20 MED ORDER — MELOXICAM 15 MG PO TABS: 15.0000 mg | ORAL_TABLET | Freq: Every day | ORAL | 1 refills | Status: DC

## 2023-05-20 MED ORDER — VALSARTAN 320 MG PO TABS: 320.0000 mg | ORAL_TABLET | Freq: Every day | ORAL | 1 refills | Status: DC

## 2023-05-20 MED ORDER — ROSUVASTATIN CALCIUM 5 MG PO TABS: 5.0000 mg | ORAL_TABLET | Freq: Every day | ORAL | 1 refills | Status: DC

## 2023-05-20 MED ORDER — GABAPENTIN 100 MG PO CAPS: 100.0000 mg | ORAL_CAPSULE | Freq: Two times a day (BID) | ORAL | 1 refills | Status: DC

## 2023-05-20 NOTE — Patient Instructions (Addendum)
Please return to the mobile unit in February 2024.  We will do fasting labs at that visit.  You are going to take an increased dose of mobic of 15 mg once daily   Roney Jaffe, PA-C Physician Assistant [facility] [email or web address]   Health Maintenance, Female Adopting a healthy lifestyle and getting preventive care are important in promoting health and wellness. Ask your health care provider about: The right schedule for you to have regular tests and exams. Things you can do on your own to prevent diseases and keep yourself healthy. What should I know about diet, weight, and exercise? Eat a healthy diet  Eat a diet that includes plenty of vegetables, fruits, low-fat dairy products, and lean protein. Do not eat a lot of foods that are high in solid fats, added sugars, or sodium. Maintain a healthy weight Body mass index (BMI) is used to identify weight problems. It estimates body fat based on height and weight. Your health care provider can help determine your BMI and help you achieve or maintain a healthy weight. Get regular exercise Get regular exercise. This is one of the most important things you can do for your health. Most adults should: Exercise for at least 150 minutes each week. The exercise should increase your heart rate and make you sweat (moderate-intensity exercise). Do strengthening exercises at least twice a week. This is in addition to the moderate-intensity exercise. Spend less time sitting. Even light physical activity can be beneficial. Watch cholesterol and blood lipids Have your blood tested for lipids and cholesterol at 64 years of age, then have this test every 5 years. Have your cholesterol levels checked more often if: Your lipid or cholesterol levels are high. You are older than 64 years of age. You are at high risk for heart disease. What should I know about cancer screening? Depending on your  health history and family history, you may need to have cancer screening at various ages. This may include screening for: Breast cancer. Cervical cancer. Colorectal cancer. Skin cancer. Lung cancer. What should I know about heart disease, diabetes, and high blood pressure? Blood pressure and heart disease High blood pressure causes heart disease and increases the risk of stroke. This is more likely to develop in people who have high blood pressure readings or are overweight. Have your blood pressure checked: Every 3-5 years if you are 53-70 years of age. Every year if you are 57 years old or older. Diabetes Have regular diabetes screenings. This checks your fasting blood sugar level. Have the screening done: Once every three years after age 36 if you are at a normal weight and have a low risk for diabetes. More often and at a younger age if you are overweight or have a high risk for diabetes. What should I know about preventing infection? Hepatitis B If you have a higher risk for hepatitis B, you should be screened for this virus. Talk with your health care provider to find out if you are at risk for hepatitis B infection. Hepatitis C Testing is recommended for: Everyone born from [birth year] through 1967. Anyone with known risk factors for hepatitis C. Sexually transmitted infections (STIs) Get screened for STIs, including gonorrhea and chlamydia, if: You are sexually active and are younger than 64 years of age. You are older than 64 years of age and your health care provider tells you that you are at risk for this type of infection. Your sexual activity has changed since you were  last screened, and you are at increased risk for chlamydia or gonorrhea. Ask your health care provider if you are at risk. Ask your health care provider about whether you are at high risk for HIV. Your health care provider may recommend a prescription medicine to help prevent HIV infection. If you choose to take  medicine to prevent HIV, you should first get tested for HIV. You should then be tested every 3 months for as long as you are taking the medicine. Pregnancy If you are about to stop having your period (premenopausal) and you may become pregnant, seek counseling before you get pregnant. Take 400 to 800 micrograms (mcg) of folic acid every day if you become pregnant. Ask for birth control (contraception) if you want to prevent pregnancy. Osteoporosis and menopause Osteoporosis is a disease in which the bones lose minerals and strength with aging. This can result in bone fractures. If you are 68 years old or older, or if you are at risk for osteoporosis and fractures, ask your health care provider if you should: Be screened for bone loss. Take a calcium or vitamin D supplement to lower your risk of fractures. Be given hormone replacement therapy (HRT) to treat symptoms of menopause. Follow these instructions at home: Alcohol use Do not drink alcohol if: Your health care provider tells you not to drink. You are pregnant, may be pregnant, or are planning to become pregnant. If you drink alcohol: Limit how much you have to: 0-1 drink a day. Know how much alcohol is in your drink. In the [place] one drink equals one 12 oz bottle of beer (355 mL), one 5 oz glass of wine (148 mL), or one 1 oz glass of hard liquor (44 mL). Lifestyle Do not use any products that contain nicotine or tobacco. These products include cigarettes, chewing tobacco, and vaping devices, such as e-cigarettes. If you need help quitting, ask your health care provider. Do not use street drugs. Do not share needles. Ask your health care provider for help if you need support or information about quitting drugs. General instructions Schedule regular health, dental, and eye exams. Stay current with your vaccines. Tell your health care provider if: You often feel depressed. You have ever been abused or do not feel safe at  home. Summary Adopting a healthy lifestyle and getting preventive care are important in promoting health and wellness. Follow your health care provider's instructions about healthy diet, exercising, and getting tested or screened for diseases. Follow your health care provider's instructions on monitoring your cholesterol and blood pressure. This information is not intended to replace advice given to you by your health care provider. Make sure you discuss any questions you have with your health care provider. Document Revised: 10/09/2020 Document Reviewed: 10/09/2020 Elsevier Patient Education  2024 [employer].

## 2023-05-20 NOTE — Progress Notes (Unsigned)
Established Patient Office Visit  Subjective   Patient ID: Deanna Mcguire, female    DOB: 06-29-1960  Age: 62 y.o. MRN: [id number]  Chief Complaint  Patient presents with   Medication Refill    Patient states she dropped her bottle of medication, but she has also taking extra pills on a few days that she felt her bp was high      States that she accidentally dropped her bottle of blood pressure medication and has been unable to retrieve all of it from under some furniture.  States she has been taking her blood pressure at home, states that she has not had readings within normal limits but has had a few readings that have been above normal limits and states that she will take a second blood pressure pill at that time.  States that she did start the Mobic and has noticed a decrease in the amount of times that she has to use Goody's powders to help her with her arthritis.  States that she did start the Flonase with improvement of the fullness in her ears.    Past Medical History:  Diagnosis Date   Arthritis    Asthma    Depression    Hyperlipidemia    Hypertension    Infected sebaceous cyst, left buttocks. 04/22/2013   Thyroid disease    Social History   Socioeconomic History   Marital status: Single    Spouse name: Not on file   Number of children: Not on file   Years of education: Not on file   Highest education level: Not on file  Occupational History   Not on file  Tobacco Use   Smoking status: Never   Smokeless tobacco: Never  Substance and Sexual Activity   Alcohol use: No    Alcohol/week: 0.0 standard drinks of alcohol   Drug use: Yes    Comment: Marijuana   Sexual activity: Not on file  Other Topics Concern   Not on file  Social History Narrative   Not on file   Social Drivers of Health   Financial Resource Strain: Not on file  Food Insecurity: Not on file  Transportation Needs: Not on file  Physical Activity: Not on file  Stress: Not on file  Social  Connections: Not on file  Intimate Partner Violence: Not on file   Family History  Problem Relation Age of Onset   Cancer Mother        kidney & lung   Heart disease Father    Stroke Father    COPD Sister    Hepatitis C Sister    Allergies  Allergen Reactions   Keflex [Cephalexin] Anaphylaxis   Penicillins Anaphylaxis    Has patient had a PCN reaction causing immediate rash, facial/tongue/throat swelling, SOB or lightheadedness with hypotension: unknown Has patient had a PCN reaction causing severe rash involving mucus membranes or skin necrosis: unknown Has patient had a PCN reaction that required hospitalization: yes Did PCN reaction occurring within the last 10 years: no, childhood If all of the above answers are "NO", then may proceed with Cephalosporin use. Pt was told never to take penicillin again.    Sulfa Antibiotics     Childhood allergy    Review of Systems  Constitutional: Negative.   HENT: Negative.    Eyes: Negative.   Respiratory:  Negative for shortness of breath.   Cardiovascular:  Negative for chest pain.  Gastrointestinal: Negative.   Genitourinary: Negative.   Musculoskeletal: Negative.  Skin: Negative.   Neurological: Negative.   Endo/Heme/Allergies: Negative.   Psychiatric/Behavioral: Negative.        Objective:     BP (!) 140/81 (BP Location: Left Arm, Patient Position: Sitting, Cuff Size: Large)   Pulse 81   Ht 5\' 3"  (1.6 m)   Wt 245 lb (111.1 kg)   SpO2 93%   BMI 43.40 kg/m  BP Readings from Last 3 Encounters:  05/20/23 (!) 140/81  03/11/23 (!) 143/83  02/04/23 (!) 150/76   Wt Readings from Last 3 Encounters:  05/20/23 245 lb (111.1 kg)  03/11/23 247 lb (112 kg)  02/04/23 243 lb (110.2 kg)    Physical Exam Vitals and nursing note reviewed.  Constitutional:      Appearance: Normal appearance.  HENT:     Head: Normocephalic and atraumatic.     Right Ear: External ear normal.     Left Ear: External ear normal.     Nose:  Nose normal.     Mouth/Throat:     Mouth: Mucous membranes are moist.     Pharynx: Oropharynx is clear.  Eyes:     Extraocular Movements: Extraocular movements intact.     Conjunctiva/sclera: Conjunctivae normal.     Pupils: Pupils are equal, round, and reactive to light.  Cardiovascular:     Rate and Rhythm: Normal rate and regular rhythm.     Pulses: Normal pulses.     Heart sounds: Normal heart sounds.  Pulmonary:     Effort: Pulmonary effort is normal.     Breath sounds: Normal breath sounds.  Musculoskeletal:        General: Normal range of motion.     Cervical back: Normal range of motion and neck supple.  Skin:    General: Skin is warm and dry.  Neurological:     General: No focal deficit present.     Mental Status: She is oriented to person, place, and time.  Psychiatric:        Mood and Affect: Mood normal.        Behavior: Behavior normal.        Thought Content: Thought content normal.        Judgment: Judgment normal.       Assessment & Plan:   Problem List Items Addressed This Visit       Cardiovascular and Mediastinum   Essential hypertension   Relevant Medications   valsartan (DIOVAN) 320 MG tablet   rosuvastatin (CRESTOR) 5 MG tablet     Musculoskeletal and Integument   Primary osteoarthritis involving multiple joints   Relevant Medications   meloxicam (MOBIC) 15 MG tablet     Other   Chronic pain in left foot   Relevant Medications   meloxicam (MOBIC) 15 MG tablet   gabapentin (NEURONTIN) 100 MG capsule   Chronic foot pain, right   Relevant Medications   meloxicam (MOBIC) 15 MG tablet   gabapentin (NEURONTIN) 100 MG capsule   Mixed hyperlipidemia   Relevant Medications   valsartan (DIOVAN) 320 MG tablet   rosuvastatin (CRESTOR) 5 MG tablet   1. Essential hypertension Continue current regimen.  Patient encouraged not to increase dose on her own.  Patient encouraged to continue checking blood pressure at home, keeping a written log and  having available for all office visits.  Patient to return to the mobile unit in approximately 3 months. - valsartan (DIOVAN) 320 MG tablet; Take 1 tablet (320 mg total) by mouth daily.  Dispense: 90 tablet; Refill:  1  2. Mixed hyperlipidemia Continue current regimen - rosuvastatin (CRESTOR) 5 MG tablet; Take 1 tablet (5 mg total) by mouth at bedtime.  Dispense: 90 tablet; Refill: 1  3. Primary osteoarthritis involving multiple joints Increase Mobic 15 mg.  Patient encouraged to discontinue Goody's powders with the use of Mobic. - meloxicam (MOBIC) 15 MG tablet; Take 1 tablet (15 mg total) by mouth daily.  Dispense: 30 tablet; Refill: 1  4. Chronic pain in left foot Continue current regimen - gabapentin (NEURONTIN) 100 MG capsule; Take 1 capsule (100 mg total) by mouth 2 (two) times daily.  Dispense: 180 capsule; Refill: 1  5. Chronic foot pain, right Continue current regimen - gabapentin (NEURONTIN) 100 MG capsule; Take 1 capsule (100 mg total) by mouth 2 (two) times daily.  Dispense: 180 capsule; Refill: 1   I have reviewed the patient's medical history (PMH, PSH, Social History, Family History, Medications, and allergies) , and have been updated if relevant. I spent 30 minutes reviewing chart and  face to face time with patient.    Return in about 3 months (around 08/18/2023) for With [facility].    Kasandra Knudsen Mayers, PA-C

## 2023-08-12 ENCOUNTER — Telehealth: Payer: Self-pay | Admitting: Physician Assistant

## 2023-08-12 NOTE — Telephone Encounter (Signed)
 Pt called to see if she should fast before her labs? If so for how long? Also when should she come in to complete those labs?  Pt is requesting a call back.

## 2023-08-13 ENCOUNTER — Encounter: Payer: Self-pay | Admitting: Physician Assistant

## 2023-08-13 ENCOUNTER — Ambulatory Visit: Admitting: Physician Assistant

## 2023-08-13 VITALS — BP 124/72 | HR 77 | Ht 63.0 in | Wt 238.0 lb

## 2023-08-13 DIAGNOSIS — M79672 Pain in left foot: Secondary | ICD-10-CM

## 2023-08-13 DIAGNOSIS — M150 Primary generalized (osteo)arthritis: Secondary | ICD-10-CM

## 2023-08-13 DIAGNOSIS — J4520 Mild intermittent asthma, uncomplicated: Secondary | ICD-10-CM

## 2023-08-13 DIAGNOSIS — E782 Mixed hyperlipidemia: Secondary | ICD-10-CM

## 2023-08-13 DIAGNOSIS — G8929 Other chronic pain: Secondary | ICD-10-CM

## 2023-08-13 DIAGNOSIS — F411 Generalized anxiety disorder: Secondary | ICD-10-CM

## 2023-08-13 DIAGNOSIS — I10 Essential (primary) hypertension: Secondary | ICD-10-CM | POA: Diagnosis not present

## 2023-08-13 DIAGNOSIS — Z114 Encounter for screening for human immunodeficiency virus [HIV]: Secondary | ICD-10-CM

## 2023-08-13 DIAGNOSIS — Z1159 Encounter for screening for other viral diseases: Secondary | ICD-10-CM

## 2023-08-13 DIAGNOSIS — Z2821 Immunization not carried out because of patient refusal: Secondary | ICD-10-CM

## 2023-08-13 DIAGNOSIS — M79671 Pain in right foot: Secondary | ICD-10-CM

## 2023-08-13 DIAGNOSIS — Z1211 Encounter for screening for malignant neoplasm of colon: Secondary | ICD-10-CM

## 2023-08-13 MED ORDER — VALSARTAN 320 MG PO TABS: 320.0000 mg | ORAL_TABLET | Freq: Every day | ORAL | 1 refills | Status: AC

## 2023-08-13 MED ORDER — MELOXICAM 15 MG PO TABS
15.0000 mg | ORAL_TABLET | Freq: Every day | ORAL | 1 refills | Status: AC
Start: 2023-08-13 — End: ?

## 2023-08-13 MED ORDER — HYDROXYZINE HCL 10 MG PO TABS: 10.0000 mg | ORAL_TABLET | Freq: Three times a day (TID) | ORAL | 1 refills | Status: AC | PRN

## 2023-08-13 MED ORDER — ROSUVASTATIN CALCIUM 5 MG PO TABS: 5.0000 mg | ORAL_TABLET | Freq: Every day | ORAL | 1 refills | Status: DC

## 2023-08-13 MED ORDER — GABAPENTIN 100 MG PO CAPS: 100.0000 mg | ORAL_CAPSULE | Freq: Every day | ORAL | 1 refills | Status: AC

## 2023-08-13 MED ORDER — VENTOLIN HFA 108 (90 BASE) MCG/ACT IN AERS: 1.0000 | INHALATION_SPRAY | Freq: Four times a day (QID) | RESPIRATORY_TRACT | 1 refills | Status: AC | PRN

## 2023-08-13 NOTE — Progress Notes (Signed)
 Established Patient Office Visit  Subjective   Patient ID: Deanna Mcguire, female    DOB: [birth year]  Age: 63 y.o. MRN: [id number]  Chief Complaint  Patient presents with   Hypertension    Follow up     Discussed the use of AI scribe software for clinical note transcription with the patient, who gave verbal consent to proceed.  History of Present Illness         The patient, with a history of hypertension, cholesterol issues, anxiety, and foot pain, presents for a routine check-up. She reports that her blood pressure has been well-managed, attributing this success to the doctor's guidance and her adherence to prescribed medication.  The patient mentions having anxiety medication on hand, although she has not needed it recently.  The patient also discusses her dietary habits, mentioning a struggle with soda consumption but an improvement in overall diet. She expresses a desire to lose weight and is taking steps towards this goal, such as weighing herself daily and considering the use of a diet tracking app.  The patient expresses reluctance towards certain vaccinations and screenings, including the shingles vaccine     Past Medical History:  Diagnosis Date   Arthritis    Asthma    Depression    Hyperlipidemia    Hypertension    Infected sebaceous cyst, left buttocks. 04/22/2013   Thyroid disease    Social History   Socioeconomic History   Marital status: Single    Spouse name: Not on file   Number of children: Not on file   Years of education: Not on file   Highest education level: Not on file  Occupational History   Not on file  Tobacco Use   Smoking status: Never   Smokeless tobacco: Never  Substance and Sexual Activity   Alcohol use: No    Alcohol/week: 0.0 standard drinks of alcohol   Drug use: Yes    Comment: Marijuana   Sexual activity: Not on file  Other Topics Concern   Not on file  Social History Narrative   Not on file   Social Drivers of Health    Financial Resource Strain: Not on file  Food Insecurity: Not on file  Transportation Needs: Not on file  Physical Activity: Not on file  Stress: Not on file  Social Connections: Not on file  Intimate Partner Violence: Not on file   Family History  Problem Relation Age of Onset   Cancer Mother        kidney & lung   Heart disease Father    Stroke Father    COPD Sister    Hepatitis C Sister    Allergies  Allergen Reactions   Keflex [Cephalexin] Anaphylaxis   Penicillins Anaphylaxis    Has patient had a PCN reaction causing immediate rash, facial/tongue/throat swelling, SOB or lightheadedness with hypotension: unknown Has patient had a PCN reaction causing severe rash involving mucus membranes or skin necrosis: unknown Has patient had a PCN reaction that required hospitalization: yes Did PCN reaction occurring within the last 10 years: no, childhood If all of the above answers are "NO", then may proceed with Cephalosporin use. Pt was told never to take penicillin again.    Sulfa Antibiotics     Childhood allergy    Review of Systems  Constitutional: Negative.   HENT: Negative.    Eyes: Negative.   Respiratory:  Negative for shortness of breath.   Cardiovascular:  Negative for chest pain.  Gastrointestinal: Negative.  Genitourinary: Negative.   Musculoskeletal: Negative.   Skin: Negative.   Neurological: Negative.   Endo/Heme/Allergies: Negative.       Objective:     BP 124/72 (BP Location: Left Arm, Patient Position: Sitting, Cuff Size: Large)   Pulse 77   Ht 5\' 3"  (1.6 m)   Wt 238 lb (108 kg)   SpO2 96%   BMI 42.16 kg/m  BP Readings from Last 3 Encounters:  08/13/23 124/72  05/20/23 (!) 140/81  03/11/23 (!) 143/83   Wt Readings from Last 3 Encounters:  08/13/23 238 lb (108 kg)  05/20/23 245 lb (111.1 kg)  03/11/23 247 lb (112 kg)    Physical Exam Vitals and nursing note reviewed.  Constitutional:      Appearance: Normal appearance.  HENT:      Head: Normocephalic and atraumatic.     Right Ear: External ear normal.     Left Ear: External ear normal.     Nose: Nose normal.     Mouth/Throat:     Mouth: Mucous membranes are moist.     Pharynx: Oropharynx is clear.  Eyes:     Extraocular Movements: Extraocular movements intact.     Conjunctiva/sclera: Conjunctivae normal.     Pupils: Pupils are equal, round, and reactive to light.  Cardiovascular:     Rate and Rhythm: Normal rate and regular rhythm.     Pulses: Normal pulses.     Heart sounds: Normal heart sounds.  Pulmonary:     Effort: Pulmonary effort is normal.     Breath sounds: Normal breath sounds.  Musculoskeletal:        General: Normal range of motion.     Cervical back: Normal range of motion and neck supple.  Skin:    General: Skin is warm and dry.  Neurological:     General: No focal deficit present.     Mental Status: She is alert and oriented to person, place, and time.  Psychiatric:        Mood and Affect: Mood normal.        Thought Content: Thought content normal.        Judgment: Judgment normal.        Assessment & Plan:   Problem List Items Addressed This Visit       Cardiovascular and Mediastinum   Essential hypertension   Relevant Medications   valsartan (DIOVAN) 320 MG tablet   rosuvastatin (CRESTOR) 5 MG tablet   Other Relevant Orders   Basic metabolic panel     Respiratory   Asthma   Relevant Medications   VENTOLIN HFA 108 (90 Base) MCG/ACT inhaler     Musculoskeletal and Integument   Primary osteoarthritis involving multiple joints   Relevant Medications   meloxicam (MOBIC) 15 MG tablet     Other   Chronic pain in left foot   Relevant Medications   gabapentin (NEURONTIN) 100 MG capsule   meloxicam (MOBIC) 15 MG tablet   Chronic foot pain, right   Relevant Medications   gabapentin (NEURONTIN) 100 MG capsule   meloxicam (MOBIC) 15 MG tablet   Anxiety state   Relevant Medications   hydrOXYzine (ATARAX) 10 MG tablet    Mixed hyperlipidemia   Relevant Medications   valsartan (DIOVAN) 320 MG tablet   rosuvastatin (CRESTOR) 5 MG tablet   Other Relevant Orders   Lipid panel   Herpes zoster vaccination declined   Other Visit Diagnoses       Screen for colon cancer    -  Primary   Relevant Orders   Cologuard     Encounter for HCV screening test for low risk patient       Relevant Orders   HCV Ab w Reflex to Quant PCR     Screening for HIV (human immunodeficiency virus)       Relevant Orders   HIV antibody (with reflex)      1. Essential hypertension (Primary) Continue current regimen.  BP at goal today. - Basic metabolic panel - valsartan (DIOVAN) 320 MG tablet; Take 1 tablet (320 mg total) by mouth daily.  Dispense: 90 tablet; Refill: 1  2. Mixed hyperlipidemia Continue current regimen - Lipid panel - rosuvastatin (CRESTOR) 5 MG tablet; Take 1 tablet (5 mg total) by mouth at bedtime.  Dispense: 90 tablet; Refill: 1  3. Mild intermittent asthma without complication Continue current regimen - VENTOLIN HFA 108 (90 Base) MCG/ACT inhaler; Inhale 1-2 puffs into the lungs every 6 (six) hours as needed for wheezing or shortness of breath.  Dispense: 18 g; Refill: 1  4. Anxiety state Continue current regimen - hydrOXYzine (ATARAX) 10 MG tablet; Take 1 tablet (10 mg total) by mouth 3 (three) times daily as needed.  Dispense: 90 tablet; Refill: 1  5. Chronic pain in left foot Change to QHS - gabapentin (NEURONTIN) 100 MG capsule; Take 1 capsule (100 mg total) by mouth at bedtime.  Dispense: 90 capsule; Refill: 1  6. Chronic foot pain, right  - gabapentin (NEURONTIN) 100 MG capsule; Take 1 capsule (100 mg total) by mouth at bedtime.  Dispense: 90 capsule; Refill: 1  7. Primary osteoarthritis involving multiple joints Continue - meloxicam (MOBIC) 15 MG tablet; Take 1 tablet (15 mg total) by mouth daily.  Dispense: 90 tablet; Refill: 1  8. Screen for colon cancer Virtual Colonoscopy 2022 -  Cologuard  9. Encounter for HCV screening test for low risk patient  - HCV Ab w Reflex to Quant PCR  10. Screening for HIV (human immunodeficiency virus)  - HIV antibody (with reflex)  11. Herpes zoster vaccination declined   General Health Maintenance Due for cervical and colon cancer screenings, discussed importance. Informed about shingles vaccine risks and complications. - Wants to wait for her insurance at 49 to go back to her previus gynecologist - Order Cologuard for colon cancer screening. - Declines shingles vaccine    Return in about 6 months (around 02/13/2024) for With [facility].    Kasandra Knudsen Mayers, PA-C

## 2023-08-13 NOTE — Patient Instructions (Addendum)
 [employer] is the app I spoke of  VISIT SUMMARY:  During your routine check-up, we discussed your well-managed blood pressure, dietary habits, and your desire to lose weight. We also addressed your concerns about certain vaccinations and screenings. We reviewed your current medications and made some adjustments to better manage your symptoms.  YOUR PLAN:  -ASTHMA: Asthma is a condition where your airways narrow and swell, making it difficult to breathe. We will ensure your inhaler prescription is current and refilled to help manage your symptoms.  -FOOT PAIN: Your foot pain is being managed with gabapentin, which can cause drowsiness. We have adjusted your prescription to take it at bedtime for the next three months to see if it helps.  -NECK PAIN: Your neck pain is being managed with Mobic 15 mg daily, which you find beneficial. Continue taking this medication as prescribed.  -HYPERLIPIDEMIA: Hyperlipidemia means you have high cholesterol levels. It's important to take your medication regularly, so we have refilled your prescription.  -OBESITY: Obesity can affect your overall health and joints. We discussed weight loss strategies, including using the Happy Scale app for tracking, meal planning with balanced meals, reducing soda intake, and increasing water consumption.  -ANXIETY: Anxiety is a feeling of worry or fear. You have requested hydroxyzine for potential future use, and we have refilled your prescription.  -GENERAL HEALTH MAINTENANCE: We discussed the importance of cervical and colon cancer screenings and the shingles vaccine. We will order a Cologuard test for colon cancer screening  INSTRUCTIONS:  Please follow up with your gynecologist to update your insurance and check Medicaid acceptance. Additionally, we will perform a cholesterol check, basic metabolic panel, and screen for HIV and hepatitis C.  Please return to the Mobile Unit in 6 months for follow up or sooner if needed.    Roney Jaffe, PA-C Physician Assistant [facility] [email or web address]  Health Maintenance, Female Adopting a healthy lifestyle and getting preventive care are important in promoting health and wellness. Ask your health care provider about: The right schedule for you to have regular tests and exams. Things you can do on your own to prevent diseases and keep yourself healthy. What should I know about diet, weight, and exercise? Eat a healthy diet  Eat a diet that includes plenty of vegetables, fruits, low-fat dairy products, and lean protein. Do not eat a lot of foods that are high in solid fats, added sugars, or sodium. Maintain a healthy weight Body mass index (BMI) is used to identify weight problems. It estimates body fat based on height and weight. Your health care provider can help determine your BMI and help you achieve or maintain a healthy weight. Get regular exercise Get regular exercise. This is one of the most important things you can do for your health. Most adults should: Exercise for at least 150 minutes each week. The exercise should increase your heart rate and make you sweat (moderate-intensity exercise). Do strengthening exercises at least twice a week. This is in addition to the moderate-intensity exercise. Spend less time sitting. Even light physical activity can be beneficial. Watch cholesterol and blood lipids Have your blood tested for lipids and cholesterol at 65 years of age, then have this test every 5 years. Have your cholesterol levels checked more often if: Your lipid or cholesterol levels are high. You are older than 65 years of age. You are at high risk for heart disease. What should I know about cancer screening? Depending on your health history and family  history, you may need to have cancer screening at various ages. This may include screening for: Breast cancer. Cervical cancer. Colorectal  cancer. Skin cancer. Lung cancer. What should I know about heart disease, diabetes, and high blood pressure? Blood pressure and heart disease High blood pressure causes heart disease and increases the risk of stroke. This is more likely to develop in people who have high blood pressure readings or are overweight. Have your blood pressure checked: Every 3-5 years if you are 64-51 years of age. Every year if you are 2 years old or older. Diabetes Have regular diabetes screenings. This checks your fasting blood sugar level. Have the screening done: Once every three years after age 78 if you are at a normal weight and have a low risk for diabetes. More often and at a younger age if you are overweight or have a high risk for diabetes. What should I know about preventing infection? Hepatitis B If you have a higher risk for hepatitis B, you should be screened for this virus. Talk with your health care provider to find out if you are at risk for hepatitis B infection. Hepatitis C Testing is recommended for: Everyone born from [birth year] through 1967. Anyone with known risk factors for hepatitis C. Sexually transmitted infections (STIs) Get screened for STIs, including gonorrhea and chlamydia, if: You are sexually active and are younger than 65 years of age. You are older than 65 years of age and your health care provider tells you that you are at risk for this type of infection. Your sexual activity has changed since you were last screened, and you are at increased risk for chlamydia or gonorrhea. Ask your health care provider if you are at risk. Ask your health care provider about whether you are at high risk for HIV. Your health care provider may recommend a prescription medicine to help prevent HIV infection. If you choose to take medicine to prevent HIV, you should first get tested for HIV. You should then be tested every 3 months for as long as you are taking the medicine. Pregnancy If you are  about to stop having your period (premenopausal) and you may become pregnant, seek counseling before you get pregnant. Take 400 to 800 micrograms (mcg) of folic acid every day if you become pregnant. Ask for birth control (contraception) if you want to prevent pregnancy. Osteoporosis and menopause Osteoporosis is a disease in which the bones lose minerals and strength with aging. This can result in bone fractures. If you are 70 years old or older, or if you are at risk for osteoporosis and fractures, ask your health care provider if you should: Be screened for bone loss. Take a calcium or vitamin D supplement to lower your risk of fractures. Be given hormone replacement therapy (HRT) to treat symptoms of menopause. Follow these instructions at home: Alcohol use Do not drink alcohol if: Your health care provider tells you not to drink. You are pregnant, may be pregnant, or are planning to become pregnant. If you drink alcohol: Limit how much you have to: 0-1 drink a day. Know how much alcohol is in your drink. In the [place] one drink equals one 12 oz bottle of beer (355 mL), one 5 oz glass of wine (148 mL), or one 1 oz glass of hard liquor (44 mL). Lifestyle Do not use any products that contain nicotine or tobacco. These products include cigarettes, chewing tobacco, and vaping devices, such as e-cigarettes. If you need help quitting, ask  your health care provider. Do not use street drugs. Do not share needles. Ask your health care provider for help if you need support or information about quitting drugs. General instructions Schedule regular health, dental, and eye exams. Stay current with your vaccines. Tell your health care provider if: You often feel depressed. You have ever been abused or do not feel safe at home. Summary Adopting a healthy lifestyle and getting preventive care are important in promoting health and wellness. Follow your health care provider's instructions about  healthy diet, exercising, and getting tested or screened for diseases. Follow your health care provider's instructions on monitoring your cholesterol and blood pressure. This information is not intended to replace advice given to you by your health care provider. Make sure you discuss any questions you have with your health care provider. Document Revised: 10/09/2020 Document Reviewed: 10/09/2020 Elsevier Patient Education  2024 [employer].

## 2023-08-19 ENCOUNTER — Encounter: Payer: Self-pay | Admitting: Physician Assistant

## 2023-08-19 LAB — BASIC METABOLIC PANEL
BUN/Creatinine Ratio: 18 (ref 12–28)
BUN: 15 mg/dL (ref 8–27)
CO2: 15 mmol/L — ABNORMAL LOW (ref 20–29)
Calcium: 9.8 mg/dL (ref 8.7–10.3)
Chloride: 104 mmol/L (ref 96–106)
Creatinine, Ser: 0.85 mg/dL (ref 0.57–1.00)
Glucose: 78 mg/dL (ref 70–99)
Potassium: 4.5 mmol/L (ref 3.5–5.2)
Sodium: 143 mmol/L (ref 134–144)
eGFR: 76 mL/min/{1.73_m2} (ref >59)

## 2023-08-19 LAB — LIPID PANEL
Chol/HDL Ratio: 4 ratio (ref 0.0–4.4)
Cholesterol, Total: 236 mg/dL — ABNORMAL HIGH (ref 100–199)
HDL: 59 mg/dL (ref >39)
LDL Chol Calc (NIH): 149 mg/dL — ABNORMAL HIGH (ref 0–99)
Triglycerides: 155 mg/dL — ABNORMAL HIGH (ref 0–149)
VLDL Cholesterol Cal: 28 mg/dL (ref 5–40)

## 2023-08-19 LAB — HCV AB W REFLEX TO QUANT PCR: HCV Ab: NONREACTIVE

## 2023-08-19 LAB — HIV ANTIBODY (ROUTINE TESTING W REFLEX): HIV Screen 4th Generation wRfx: NONREACTIVE

## 2023-08-19 LAB — HCV INTERPRETATION

## 2023-08-19 MED ORDER — ROSUVASTATIN CALCIUM 20 MG PO TABS: 20.0000 mg | ORAL_TABLET | Freq: Every day | ORAL | 1 refills | Status: AC

## 2023-08-19 NOTE — Addendum Note (Signed)
 Addended by: Roney Jaffe on: 08/19/2023 11:53 AM   Modules accepted: Orders

## 2023-08-21 NOTE — Telephone Encounter (Signed)
 Patient has been seen recently since message.

## 2023-10-28 ENCOUNTER — Telehealth: Payer: Self-pay

## 2023-10-28 NOTE — Telephone Encounter (Signed)
 Patient called in regards to a friend that stepped on a nail, and is needed a Tetanus vaccination. As discussed, I was unable to provide cost range, however I did mention that we could see the individual and they could receive a Tetanus Vaccination today, no payment would be collected at the time of visit, but they would receive a bill in the mail. She stated she would pass over the information to the individual and they would either call me back or come to our location today or tomorrow. Mobile locations and Operating hours were discussed as well.

## 2024-02-03 ENCOUNTER — Other Ambulatory Visit: Payer: Self-pay | Admitting: Family Medicine

## 2024-02-03 DIAGNOSIS — Z1231 Encounter for screening mammogram for malignant neoplasm of breast: Secondary | ICD-10-CM

## 2024-02-25 ENCOUNTER — Ambulatory Visit: Payer: Medicare (Managed Care)
